# Patient Record
Sex: Female | Born: 1965 | Race: White | Hispanic: No | Smoking: Never smoker
Health system: Southern US, Community
[De-identification: ages and names within clinical notes are randomized; demographics above are authoritative.]

## PROBLEM LIST (undated history)

## (undated) DIAGNOSIS — R519 Headache, unspecified: Secondary | ICD-10-CM

## (undated) DIAGNOSIS — F329 Major depressive disorder, single episode, unspecified: Secondary | ICD-10-CM

## (undated) DIAGNOSIS — R51 Headache: Secondary | ICD-10-CM

## (undated) DIAGNOSIS — F32A Depression, unspecified: Secondary | ICD-10-CM

## (undated) DIAGNOSIS — I10 Essential (primary) hypertension: Secondary | ICD-10-CM

## (undated) HISTORY — DX: Depression, unspecified: F32.A

## (undated) HISTORY — DX: Major depressive disorder, single episode, unspecified: F32.9

## (undated) HISTORY — DX: Essential (primary) hypertension: I10

## (undated) HISTORY — DX: Headache, unspecified: R51.9

## (undated) HISTORY — DX: Headache: R51

## (undated) HISTORY — PX: TUBAL LIGATION: SHX77

---

## 2005-06-22 HISTORY — PX: ABDOMINAL HYSTERECTOMY: SHX81

## 2012-06-07 ENCOUNTER — Ambulatory Visit (INDEPENDENT_AMBULATORY_CARE_PROVIDER_SITE_OTHER): Payer: PRIVATE HEALTH INSURANCE | Admitting: Licensed Clinical Social Worker

## 2012-06-07 DIAGNOSIS — IMO0002 Reserved for concepts with insufficient information to code with codable children: Secondary | ICD-10-CM

## 2012-07-13 ENCOUNTER — Ambulatory Visit (INDEPENDENT_AMBULATORY_CARE_PROVIDER_SITE_OTHER): Payer: PRIVATE HEALTH INSURANCE | Admitting: Licensed Clinical Social Worker

## 2012-07-13 DIAGNOSIS — IMO0002 Reserved for concepts with insufficient information to code with codable children: Secondary | ICD-10-CM

## 2012-08-09 ENCOUNTER — Ambulatory Visit: Payer: PRIVATE HEALTH INSURANCE | Admitting: Licensed Clinical Social Worker

## 2012-08-18 ENCOUNTER — Ambulatory Visit (INDEPENDENT_AMBULATORY_CARE_PROVIDER_SITE_OTHER): Payer: PRIVATE HEALTH INSURANCE | Admitting: Licensed Clinical Social Worker

## 2012-09-06 ENCOUNTER — Ambulatory Visit (INDEPENDENT_AMBULATORY_CARE_PROVIDER_SITE_OTHER): Payer: PRIVATE HEALTH INSURANCE | Admitting: Licensed Clinical Social Worker

## 2012-09-06 DIAGNOSIS — IMO0002 Reserved for concepts with insufficient information to code with codable children: Secondary | ICD-10-CM

## 2012-10-11 ENCOUNTER — Ambulatory Visit (INDEPENDENT_AMBULATORY_CARE_PROVIDER_SITE_OTHER): Payer: PRIVATE HEALTH INSURANCE | Admitting: Licensed Clinical Social Worker

## 2012-10-11 DIAGNOSIS — IMO0002 Reserved for concepts with insufficient information to code with codable children: Secondary | ICD-10-CM

## 2012-11-08 ENCOUNTER — Ambulatory Visit (INDEPENDENT_AMBULATORY_CARE_PROVIDER_SITE_OTHER): Payer: PRIVATE HEALTH INSURANCE | Admitting: Licensed Clinical Social Worker

## 2012-11-08 DIAGNOSIS — IMO0002 Reserved for concepts with insufficient information to code with codable children: Secondary | ICD-10-CM

## 2012-12-13 ENCOUNTER — Ambulatory Visit (INDEPENDENT_AMBULATORY_CARE_PROVIDER_SITE_OTHER): Payer: PRIVATE HEALTH INSURANCE | Admitting: Licensed Clinical Social Worker

## 2012-12-13 DIAGNOSIS — IMO0002 Reserved for concepts with insufficient information to code with codable children: Secondary | ICD-10-CM

## 2013-01-24 ENCOUNTER — Ambulatory Visit (INDEPENDENT_AMBULATORY_CARE_PROVIDER_SITE_OTHER): Payer: PRIVATE HEALTH INSURANCE | Admitting: Licensed Clinical Social Worker

## 2013-01-24 DIAGNOSIS — IMO0002 Reserved for concepts with insufficient information to code with codable children: Secondary | ICD-10-CM

## 2013-02-21 ENCOUNTER — Ambulatory Visit: Payer: PRIVATE HEALTH INSURANCE | Admitting: Licensed Clinical Social Worker

## 2013-03-14 ENCOUNTER — Ambulatory Visit (INDEPENDENT_AMBULATORY_CARE_PROVIDER_SITE_OTHER): Payer: PRIVATE HEALTH INSURANCE | Admitting: Family Medicine

## 2013-03-14 ENCOUNTER — Encounter: Payer: Self-pay | Admitting: Family Medicine

## 2013-03-14 ENCOUNTER — Ambulatory Visit (INDEPENDENT_AMBULATORY_CARE_PROVIDER_SITE_OTHER): Payer: PRIVATE HEALTH INSURANCE | Admitting: Licensed Clinical Social Worker

## 2013-03-14 VITALS — BP 116/76 | HR 70 | Temp 98.6°F | Ht 64.0 in | Wt 188.0 lb

## 2013-03-14 DIAGNOSIS — Z23 Encounter for immunization: Secondary | ICD-10-CM

## 2013-03-14 DIAGNOSIS — F418 Other specified anxiety disorders: Secondary | ICD-10-CM | POA: Insufficient documentation

## 2013-03-14 DIAGNOSIS — G4700 Insomnia, unspecified: Secondary | ICD-10-CM

## 2013-03-14 DIAGNOSIS — IMO0002 Reserved for concepts with insufficient information to code with codable children: Secondary | ICD-10-CM

## 2013-03-14 DIAGNOSIS — E669 Obesity, unspecified: Secondary | ICD-10-CM | POA: Insufficient documentation

## 2013-03-14 DIAGNOSIS — I10 Essential (primary) hypertension: Secondary | ICD-10-CM

## 2013-03-14 DIAGNOSIS — R0609 Other forms of dyspnea: Secondary | ICD-10-CM

## 2013-03-14 DIAGNOSIS — F341 Dysthymic disorder: Secondary | ICD-10-CM

## 2013-03-14 DIAGNOSIS — R0683 Snoring: Secondary | ICD-10-CM

## 2013-03-14 MED ORDER — CHLORTHALIDONE 50 MG PO TABS: 50.0000 mg | ORAL_TABLET | Freq: Every day | ORAL | Status: DC

## 2013-03-14 MED ORDER — CLONAZEPAM 1 MG PO TABS: 1.0000 mg | ORAL_TABLET | Freq: Every day | ORAL | Status: DC

## 2013-03-14 MED ORDER — ESCITALOPRAM OXALATE 20 MG PO TABS: 20.0000 mg | ORAL_TABLET | Freq: Every day | ORAL | Status: DC

## 2013-03-14 NOTE — Assessment & Plan Note (Signed)
con't meds stable 

## 2013-03-14 NOTE — Patient Instructions (Signed)

## 2013-03-14 NOTE — Assessment & Plan Note (Signed)
Cont counseling and meds

## 2013-03-14 NOTE — Progress Notes (Signed)
  Subjective:    Patient here for follow-up of elevated blood pressure.  She is not exercising and is adherent to a low-salt diet.  Blood pressure is well controlled at home. Cardiac symptoms: none. Patient denies: chest pain, chest pressure/discomfort, claudication, dyspnea, exertional chest pressure/discomfort, fatigue, irregular heart beat, lower extremity edema, near-syncope, orthopnea, palpitations, paroxysmal nocturnal dyspnea, syncope and tachypnea. Cardiovascular risk factors: hypertension, obesity (BMI >= 30 kg/m2) and sedentary lifestyle. Use of agents associated with hypertension: none. History of target organ damage: none.  The following portions of the patient's history were reviewed and updated as appropriate: allergies, current medications, past family history, past medical history, past social history, past surgical history and problem list.  Review of Systems Pertinent items are noted in HPI.     Objective:    BP 116/76  Pulse 70  Temp(Src) 98.6 F (37 C) (Oral)  Ht 5\' 4"  (1.626 m)  Wt 188 lb (85.276 kg)  BMI 32.25 kg/m2  SpO2 98% General appearance: alert, cooperative, appears stated age and no distress Neck: no adenopathy, no carotid bruit, no JVD, supple, symmetrical, trachea midline and thyroid not enlarged, symmetric, no tenderness/mass/nodules Lungs: clear to auscultation bilaterally Heart: S1, S2 normal Extremities: extremities normal, atraumatic, no cyanosis or edema    Assessment:    Hypertension, normal blood pressure . Evidence of target organ damage: none.    Plan:    Medication: no change. Dietary sodium restriction. Regular aerobic exercise. Follow up: 6 months and as needed.

## 2013-04-04 ENCOUNTER — Institutional Professional Consult (permissible substitution): Payer: 59 | Admitting: Pulmonary Disease

## 2013-04-11 ENCOUNTER — Ambulatory Visit: Payer: PRIVATE HEALTH INSURANCE | Admitting: Licensed Clinical Social Worker

## 2013-05-10 ENCOUNTER — Other Ambulatory Visit: Payer: Self-pay | Admitting: Family Medicine

## 2013-05-11 NOTE — Telephone Encounter (Signed)
Patient is requesting refill for Klonopin.  Last seen on 03/14/2013, no UDS or contract on file. Please advise. SW

## 2013-05-12 ENCOUNTER — Encounter: Payer: Self-pay | Admitting: Family Medicine

## 2013-05-12 ENCOUNTER — Ambulatory Visit (INDEPENDENT_AMBULATORY_CARE_PROVIDER_SITE_OTHER): Payer: PRIVATE HEALTH INSURANCE | Admitting: Family Medicine

## 2013-05-12 ENCOUNTER — Other Ambulatory Visit: Payer: Self-pay | Admitting: Family Medicine

## 2013-05-12 VITALS — BP 132/84 | HR 82 | Temp 98.0°F | Resp 16 | Wt 193.4 lb

## 2013-05-12 DIAGNOSIS — J0100 Acute maxillary sinusitis, unspecified: Secondary | ICD-10-CM

## 2013-05-12 MED ORDER — AMOXICILLIN 875 MG PO TABS: 875.0000 mg | ORAL_TABLET | Freq: Two times a day (BID) | ORAL | Status: DC

## 2013-05-12 MED ORDER — PROMETHAZINE-DM 6.25-15 MG/5ML PO SYRP: 5.0000 mL | ORAL_SOLUTION | Freq: Four times a day (QID) | ORAL | Status: DC | PRN

## 2013-05-12 NOTE — Assessment & Plan Note (Signed)
Pt's sxs and PE consistent w/ infxn.  Start abx, cough meds prn.  Reviewed supportive care and red flags that should prompt return.  Pt expressed understanding and is in agreement w/ plan.  

## 2013-05-12 NOTE — Patient Instructions (Signed)
Follow up as needed Start the Amoxicillin- twice daily, take w/ food Drink plenty of fluids Use the cough syrup on nights and weekends (will cause drowsiness) REST! Mucinex DM for daytime cough Call with any questions or concerns Hang in there! Happy Holidays!

## 2013-05-12 NOTE — Progress Notes (Signed)
  Subjective:    Patient ID: Kaylee Gray, female    DOB: [birth year], 47 y.o.   MRN: [id number]  HPI Pre visit review using our clinic review tool, if applicable. No additional management support is needed unless otherwise documented below in the visit note.  URI- sxs started 5 days ago w/ a sore throat.  + nasal congestion, HA, sinus pain.  Cough is intermittently productive, chest is now tight.  No fevers.  No known sick contacts.  R ear pain.  + tooth pain.  No N/V/D.   Review of Systems For ROS see HPI     Objective:   Physical Exam  Vitals reviewed. Constitutional: She appears well-developed and well-nourished. No distress.  HENT:  Head: Normocephalic and atraumatic.  Right Ear: Tympanic membrane normal.  Left Ear: Tympanic membrane normal.  Nose: Mucosal edema and rhinorrhea present. Right sinus exhibits maxillary sinus tenderness. Right sinus exhibits no frontal sinus tenderness. Left sinus exhibits maxillary sinus tenderness. Left sinus exhibits no frontal sinus tenderness.  Mouth/Throat: Uvula is midline and mucous membranes are normal. Posterior oropharyngeal erythema present. No oropharyngeal exudate.  Eyes: Conjunctivae and EOM are normal. Pupils are equal, round, and reactive to light.  Neck: Normal range of motion. Neck supple.  Cardiovascular: Normal rate, regular rhythm and normal heart sounds.   Pulmonary/Chest: Effort normal and breath sounds normal. No respiratory distress. She has no wheezes.  Lymphadenopathy:    She has no cervical adenopathy.          Assessment & Plan:

## 2013-05-29 ENCOUNTER — Encounter: Payer: Self-pay | Admitting: Family Medicine

## 2013-05-29 ENCOUNTER — Ambulatory Visit (INDEPENDENT_AMBULATORY_CARE_PROVIDER_SITE_OTHER): Payer: PRIVATE HEALTH INSURANCE | Admitting: Family Medicine

## 2013-05-29 VITALS — BP 112/76 | HR 73 | Temp 98.7°F | Wt 191.0 lb

## 2013-05-29 DIAGNOSIS — J209 Acute bronchitis, unspecified: Secondary | ICD-10-CM

## 2013-05-29 DIAGNOSIS — R0789 Other chest pain: Secondary | ICD-10-CM

## 2013-05-29 MED ORDER — HYDROCODONE-HOMATROPINE 5-1.5 MG/5ML PO SYRP: 5.0000 mL | ORAL_SOLUTION | Freq: Four times a day (QID) | ORAL | Status: DC | PRN

## 2013-05-29 MED ORDER — AZITHROMYCIN 250 MG PO TABS: ORAL_TABLET | ORAL | Status: DC

## 2013-05-29 MED ORDER — ALBUTEROL SULFATE (2.5 MG/3ML) 0.083% IN NEBU
2.5000 mg | INHALATION_SOLUTION | Freq: Once | RESPIRATORY_TRACT | Status: AC
  Administered 2013-05-29: 2.5 mg via RESPIRATORY_TRACT

## 2013-05-29 MED ORDER — PREDNISONE 10 MG PO TABS: ORAL_TABLET | ORAL | Status: DC

## 2013-05-29 NOTE — Patient Instructions (Addendum)
rto 2 weeks for f/u-- you can cancel the appointment if symptoms completely resolved.    Bronchitis Bronchitis is the body's way of reacting to injury and/or infection (inflammation) of the bronchi. Bronchi are the air tubes that extend from the windpipe into the lungs. If the inflammation becomes severe, it may cause shortness of breath. CAUSES  Inflammation may be caused by:  A virus.  Germs (bacteria).  Dust.  Allergens.  Pollutants and many other irritants. The cells lining the bronchial tree are covered with tiny hairs (cilia). These constantly beat upward, away from the lungs, toward the mouth. This keeps the lungs free of pollutants. When these cells become too irritated and are unable to do their job, mucus begins to develop. This causes the characteristic cough of bronchitis. The cough clears the lungs when the cilia are unable to do their job. Without either of these protective mechanisms, the mucus would settle in the lungs. Then you would develop pneumonia. Smoking is a common cause of bronchitis and can contribute to pneumonia. Stopping this habit is the single most important thing you can do to help yourself. TREATMENT   Your caregiver may prescribe an antibiotic if the cough is caused by bacteria. Also, medicines that open up your airways make it easier to breathe. Your caregiver may also recommend or prescribe an expectorant. It will loosen the mucus to be coughed up. Only take over-the-counter or prescription medicines for pain, discomfort, or fever as directed by your caregiver.  Removing whatever causes the problem (smoking, for example) is critical to preventing the problem from getting worse.  Cough suppressants may be prescribed for relief of cough symptoms.  Inhaled medicines may be prescribed to help with symptoms now and to help prevent problems from returning.  For those with recurrent (chronic) bronchitis, there may be a need for steroid medicines. SEEK  IMMEDIATE MEDICAL CARE IF:   During treatment, you develop more pus-like mucus (purulent sputum).  You have a fever.  You become progressively more ill.  You have increased difficulty breathing, wheezing, or shortness of breath. It is necessary to seek immediate medical care if you are elderly or sick from any other disease. MAKE SURE YOU:   Understand these instructions.  Will watch your condition.  Will get help right away if you are not doing well or get worse. Document Released: 06/08/2005 Document Revised: 02/08/2013 Document Reviewed: 01/31/2013 [facility] [place] Patient Information 2014 [place], [place].

## 2013-05-29 NOTE — Progress Notes (Signed)
Pre visit review using our clinic review tool, if applicable. No additional management support is needed unless otherwise documented below in the visit note. 

## 2013-05-29 NOTE — Progress Notes (Signed)
  Subjective:     Kaylee Gray is a 47 y.o. female here for evaluation of a cough. Onset of symptoms was several weeks ago. Symptoms have been gradually worsening since that time. The cough is barky, harsh and productive and is aggravated by infection and reclining position. Associated symptoms include: postnasal drip, shortness of breath, sputum production and wheezing. Patient does not have a history of asthma. Patient does not have a history of environmental allergens. Patient has not traveled recently. Patient does not have a history of smoking. Patient has not had a previous chest x-ray. Patient has not had a PPD done.  The following portions of the patient's history were reviewed and updated as appropriate: allergies, current medications, past family history, past medical history, past social history, past surgical history and problem list.  Review of Systems Pertinent items are noted in HPI.    Objective:    Oxygen saturation 97% on room air BP 112/76  Pulse 73  Temp(Src) 98.7 F (37.1 C) (Oral)  Wt 191 lb (86.637 kg)  SpO2 97% General appearance: alert, cooperative, appears stated age and no distress Ears: normal TM's and external ear canals both ears Nose: Nares normal. Septum midline. Mucosa normal. No drainage or sinus tenderness. Throat: abnormal findings: moderate oropharyngeal erythema and pnd Neck: no adenopathy, supple, symmetrical, trachea midline and thyroid not enlarged, symmetric, no tenderness/mass/nodules Lungs: diminished breath sounds bilaterally and wheezes bilaterally Heart: S1, S2 normal    Assessment:    Acute Bronchitis    Plan:    Antibiotics per medication orders. Antitussives per medication orders. Avoid exposure to tobacco smoke and fumes. Call if shortness of breath worsens, blood in sputum, change in character of cough, development of fever or chills, inability to maintain nutrition and hydration. Avoid exposure to tobacco smoke and  fumes. steroid taper

## 2013-05-31 ENCOUNTER — Telehealth: Payer: Self-pay | Admitting: *Deleted

## 2013-05-31 NOTE — Telephone Encounter (Signed)
Patient called and left message on triage phone stating that she is not feeling any better and would like for someone to return her call. Called patient and left message for her return call to office.

## 2013-05-31 NOTE — Telephone Encounter (Signed)
Patient called and stated that she has not been able to urinate since starting on the medications that was prescribed to her on 05/29/2013. Patient states that she read that oliguria is a side effect for one of the medication but not sure which on. Patients states that her bladder hurts because she is unable to urinate. Would like to know what can be done about this situation. Please advise. SW

## 2013-05-31 NOTE — Telephone Encounter (Signed)
Patient was made aware to stop taking the cough medicine and she states that she is not taking any medications over the counter. Patient will call the office if symptoms doesn't improve. SW

## 2013-05-31 NOTE — Telephone Encounter (Signed)
Stop the cough syrup and see if it improves, if she is taking otc antihistamine , that can cause it as well.

## 2013-06-08 ENCOUNTER — Telehealth: Payer: Self-pay | Admitting: Family Medicine

## 2013-06-08 MED ORDER — NYSTATIN 100000 UNIT/ML MT SUSP: 5.0000 mL | Freq: Four times a day (QID) | OROMUCOSAL | Status: DC

## 2013-06-08 NOTE — Telephone Encounter (Signed)
Med filled and patient notified.

## 2013-06-08 NOTE — Telephone Encounter (Signed)
Dr Lowne pt 

## 2013-06-08 NOTE — Telephone Encounter (Signed)
Patient states that the antibiotic is causing her to have thrush. She is unable to keep her appointment she had scheduled for Monday and states that she cannot come in tomorrow. Wants to know if she can be worked in this afternoon or if something can be called in to her pharmacy.

## 2013-06-08 NOTE — Telephone Encounter (Signed)
Nystatin swish and spit 5 ml po qid   150  cc 

## 2013-06-12 ENCOUNTER — Ambulatory Visit: Payer: PRIVATE HEALTH INSURANCE | Admitting: Family Medicine

## 2013-06-23 ENCOUNTER — Telehealth: Payer: Self-pay | Admitting: *Deleted

## 2013-06-23 ENCOUNTER — Other Ambulatory Visit: Payer: Self-pay | Admitting: Family Medicine

## 2013-06-23 NOTE — Telephone Encounter (Signed)
Last seen 05/29/13 and filled 05/10/13 #30. Please advise     KP

## 2013-06-23 NOTE — Telephone Encounter (Signed)
Patient called and stated that she need a refill on her Clonazepam. After reviewing patient chart medication has already been filled and sent to pharmacy

## 2013-07-04 ENCOUNTER — Other Ambulatory Visit: Payer: Self-pay | Admitting: Family Medicine

## 2013-07-20 ENCOUNTER — Other Ambulatory Visit: Payer: Self-pay | Admitting: Family Medicine

## 2013-07-21 NOTE — Telephone Encounter (Signed)
Last seen 05/29/13 and filled 06/23/13 #30. Please advise KP No contract or UDS on file.    Please advise      KP

## 2013-08-22 ENCOUNTER — Other Ambulatory Visit: Payer: Self-pay | Admitting: Family Medicine

## 2013-08-23 ENCOUNTER — Other Ambulatory Visit: Payer: Self-pay | Admitting: Family Medicine

## 2013-08-23 DIAGNOSIS — G4700 Insomnia, unspecified: Secondary | ICD-10-CM

## 2013-08-23 NOTE — Telephone Encounter (Signed)
Patient request a refill on Klonopin 1 mg and would like an additional refill.  Last refill 07/20/2013 Acute visit 05/29/2013 No UDS or contract on file.

## 2013-08-23 NOTE — Telephone Encounter (Signed)
Rx for klonopin faxed to [facility], pt made aware

## 2013-08-24 ENCOUNTER — Telehealth: Payer: Self-pay | Admitting: *Deleted

## 2013-08-24 NOTE — Telephone Encounter (Signed)
Rx fro klonopin faxed to [facility], pt made aware.

## 2013-09-17 ENCOUNTER — Other Ambulatory Visit: Payer: Self-pay | Admitting: Family Medicine

## 2013-09-21 ENCOUNTER — Other Ambulatory Visit: Payer: Self-pay | Admitting: Family Medicine

## 2013-09-25 NOTE — Telephone Encounter (Signed)
Last seen 05/29/13 and filled 08/23/13 #30 No UDS or contract on file.   Please advise      KP

## 2013-09-25 NOTE — Telephone Encounter (Addendum)
Patient called and stated that her pharmacy still does not have a refill for her clonazePAM (KLONOPIN) 1 MG tablet. Please advise

## 2013-10-05 ENCOUNTER — Ambulatory Visit (INDEPENDENT_AMBULATORY_CARE_PROVIDER_SITE_OTHER): Payer: PRIVATE HEALTH INSURANCE | Admitting: Licensed Clinical Social Worker

## 2013-10-05 DIAGNOSIS — F331 Major depressive disorder, recurrent, moderate: Secondary | ICD-10-CM

## 2013-10-17 ENCOUNTER — Ambulatory Visit (INDEPENDENT_AMBULATORY_CARE_PROVIDER_SITE_OTHER): Payer: PRIVATE HEALTH INSURANCE | Admitting: Licensed Clinical Social Worker

## 2013-10-17 DIAGNOSIS — IMO0002 Reserved for concepts with insufficient information to code with codable children: Secondary | ICD-10-CM

## 2013-10-24 ENCOUNTER — Ambulatory Visit (INDEPENDENT_AMBULATORY_CARE_PROVIDER_SITE_OTHER): Payer: PRIVATE HEALTH INSURANCE | Admitting: Licensed Clinical Social Worker

## 2013-10-24 DIAGNOSIS — IMO0002 Reserved for concepts with insufficient information to code with codable children: Secondary | ICD-10-CM

## 2013-10-26 ENCOUNTER — Telehealth: Payer: Self-pay | Admitting: Family Medicine

## 2013-10-26 MED ORDER — CLONAZEPAM 1 MG PO TABS: ORAL_TABLET | ORAL | Status: DC

## 2013-10-26 NOTE — Telephone Encounter (Signed)
Rx sent to [facility] on [address]     Elaina PatteeKP

## 2013-10-26 NOTE — Telephone Encounter (Signed)
Caller name:Kaylee Bascom LevelsFrazier  Relation to GN:FAOZHYQpt:patient Call back number:[phone] Pharmacy:  Reason for call: to request a refill for clonazePAM (KLONOPIN) 1 MG tablet

## 2013-10-26 NOTE — Telephone Encounter (Signed)
Ok to refill x1  2 refills 

## 2013-10-26 NOTE — Telephone Encounter (Signed)
Last seen 05/29/13 and filled 09/25/13 #30 Contract signed 09/28/13   Please advise     KP

## 2013-10-31 ENCOUNTER — Encounter: Payer: Self-pay | Admitting: Family Medicine

## 2013-11-16 ENCOUNTER — Ambulatory Visit (INDEPENDENT_AMBULATORY_CARE_PROVIDER_SITE_OTHER): Payer: PRIVATE HEALTH INSURANCE | Admitting: Licensed Clinical Social Worker

## 2013-11-16 DIAGNOSIS — IMO0002 Reserved for concepts with insufficient information to code with codable children: Secondary | ICD-10-CM

## 2013-11-23 ENCOUNTER — Encounter: Payer: Self-pay | Admitting: Family Medicine

## 2013-11-23 ENCOUNTER — Ambulatory Visit (INDEPENDENT_AMBULATORY_CARE_PROVIDER_SITE_OTHER): Payer: PRIVATE HEALTH INSURANCE | Admitting: Family Medicine

## 2013-11-23 VITALS — BP 110/72 | HR 57 | Temp 98.6°F | Ht 64.0 in | Wt 188.0 lb

## 2013-11-23 DIAGNOSIS — I10 Essential (primary) hypertension: Secondary | ICD-10-CM

## 2013-11-23 DIAGNOSIS — J329 Chronic sinusitis, unspecified: Secondary | ICD-10-CM

## 2013-11-23 MED ORDER — CEFDINIR 300 MG PO CAPS: 300.0000 mg | ORAL_CAPSULE | Freq: Two times a day (BID) | ORAL | Status: DC

## 2013-11-23 NOTE — Progress Notes (Signed)
Pre visit review using our clinic review tool, if applicable. No additional management support is needed unless otherwise documented below in the visit note. 

## 2013-11-23 NOTE — Patient Instructions (Signed)
Mucinex 600 mg twice a day x 14 days Probiotic daily such as [employer]  Sinusitis Sinusitis is redness, soreness, and swelling (inflammation) of the paranasal sinuses. Paranasal sinuses are air pockets within the bones of your face (beneath the eyes, the middle of the forehead, or above the eyes). In healthy paranasal sinuses, mucus is able to drain out, and air is able to circulate through them by way of your nose. However, when your paranasal sinuses are inflamed, mucus and air can become trapped. This can allow bacteria and other germs to grow and cause infection. Sinusitis can develop quickly and last only a short time (acute) or continue over a long period (chronic). Sinusitis that lasts for more than 12 weeks is considered chronic.  CAUSES  Causes of sinusitis include:  Allergies.  Structural abnormalities, such as displacement of the cartilage that separates your nostrils (deviated septum), which can decrease the air flow through your nose and sinuses and affect sinus drainage.  Functional abnormalities, such as when the small hairs (cilia) that line your sinuses and help remove mucus do not work properly or are not present. SYMPTOMS  Symptoms of acute and chronic sinusitis are the same. The primary symptoms are pain and pressure around the affected sinuses. Other symptoms include:  Upper toothache.  Earache.  Headache.  Bad breath.  Decreased sense of smell and taste.  A cough, which worsens when you are lying flat.  Fatigue.  Fever.  Thick drainage from your nose, which often is green and may contain pus (purulent).  Swelling and warmth over the affected sinuses. DIAGNOSIS  Your caregiver will perform a physical exam. During the exam, your caregiver may:  Look in your nose for signs of abnormal growths in your nostrils (nasal polyps).  Tap over the affected sinus to check for signs of infection.  View the inside of your sinuses (endoscopy) with a  special imaging device with a light attached (endoscope), which is inserted into your sinuses. If your caregiver suspects that you have chronic sinusitis, one or more of the following tests may be recommended:  Allergy tests.  Nasal culture A sample of mucus is taken from your nose and sent to a lab and screened for bacteria.  Nasal cytology A sample of mucus is taken from your nose and examined by your caregiver to determine if your sinusitis is related to an allergy. TREATMENT  Most cases of acute sinusitis are related to a viral infection and will resolve on their own within 10 days. Sometimes medicines are prescribed to help relieve symptoms (pain medicine, decongestants, nasal steroid sprays, or saline sprays).  However, for sinusitis related to a bacterial infection, your caregiver will prescribe antibiotic medicines. These are medicines that will help kill the bacteria causing the infection.  Rarely, sinusitis is caused by a fungal infection. In theses cases, your caregiver will prescribe antifungal medicine. For some cases of chronic sinusitis, surgery is needed. Generally, these are cases in which sinusitis recurs more than 3 times per year, despite other treatments. HOME CARE INSTRUCTIONS   Drink plenty of water. Water helps thin the mucus so your sinuses can drain more easily.  Use a humidifier.  Inhale steam 3 to 4 times a day (for example, sit in the bathroom with the shower running).  Apply a warm, moist washcloth to your face 3 to 4 times a day, or as directed by your caregiver.  Use saline nasal sprays to help moisten and clean your sinuses.  Take over-the-counter  or prescription medicines for pain, discomfort, or fever only as directed by your caregiver. SEEK IMMEDIATE MEDICAL CARE IF:  You have increasing pain or severe headaches.  You have nausea, vomiting, or drowsiness.  You have swelling around your face.  You have vision problems.  You have a stiff  neck.  You have difficulty breathing. MAKE SURE YOU:   Understand these instructions.  Will watch your condition.  Will get help right away if you are not doing well or get worse. Document Released: 06/08/2005 Document Revised: 08/31/2011 Document Reviewed: 06/23/2011 [facility] Patient Information 2014 [place], [place].

## 2013-11-27 ENCOUNTER — Encounter: Payer: Self-pay | Admitting: Family Medicine

## 2013-11-27 DIAGNOSIS — J329 Chronic sinusitis, unspecified: Secondary | ICD-10-CM | POA: Insufficient documentation

## 2013-11-27 NOTE — Progress Notes (Signed)
Patient ID: Kaylee Gray, female   DOB: 04-06-1966, 48 y.o.   MRN: [id number] Kaylee Gray [id number] Jan 22, 1966 11/27/2013      Progress Note-Follow Up  Subjective  Chief Complaint  Chief Complaint  Patient presents with  . Headache    X 3 days  . Sinusitis    X 3 days  . Otalgia    X 3 days- left  . Cough    X 3 days- no production    HPI  Patient is a 48 year old female in today for routine medical care. She is complaining of several days worth of head congestion and headache. She has frontal and maxillary pressure as well as nasal congestion. She has some intermittent left ear pain and cough. She complains of sore throat, malaise and myalgias. Denies fevers and chills. Denies CP/palp/SOBfevers/GI or GU c/o. Taking meds as prescribed  Past Medical History  Diagnosis Date  . Depression   . Hypertension   . Frequent headaches     Past Surgical History  Procedure Laterality Date  . Tubal ligation    . Cesarean section    . Abdominal hysterectomy  2007    endometriosis,  TAH BSO    Family History  Problem Relation Age of Onset  . COPD Mother   . COPD Father   . Emphysema Mother     Smoker  . Emphysema Father     Smoker    History   Social History  . Marital Status: Divorced    Spouse Name: N/A    Number of Children: N/A  . Years of Education: N/A   Occupational History  . Not on file.   Social History Main Topics  . Smoking status: Never Smoker   . Smokeless tobacco: Never Used  . Alcohol Use: No  . Drug Use: No  . Sexual Activity: Not on file   Other Topics Concern  . Not on file   Social History Narrative  . No narrative on file    Current Outpatient Prescriptions on File Prior to Visit  Medication Sig Dispense Refill  . chlorthalidone (HYGROTON) 50 MG tablet TAKE 1 TABLET DAILY.  90 tablet  1  . clonazePAM (KLONOPIN) 1 MG tablet TAKE 1 TABLET BY MOUTH EVERY DAY  30 tablet  2  . escitalopram (LEXAPRO) 20 MG tablet TAKE 1 TABLET (20 MG  TOTAL) BY MOUTH DAILY.  90 tablet  1   No current facility-administered medications on file prior to visit.    No Known Allergies  Review of Systems  Review of Systems  Constitutional: Positive for malaise/fatigue. Negative for fever.  HENT: Positive for congestion, ear pain and sore throat.   Eyes: Negative for discharge.  Respiratory: Positive for cough. Negative for shortness of breath.   Cardiovascular: Negative for chest pain, palpitations and leg swelling.  Gastrointestinal: Negative for nausea, abdominal pain and diarrhea.  Genitourinary: Negative for dysuria.  Musculoskeletal: Positive for myalgias. Negative for falls.  Skin: Negative for rash.  Neurological: Positive for headaches. Negative for loss of consciousness.  Endo/Heme/Allergies: Negative for polydipsia.  Psychiatric/Behavioral: Negative for depression and suicidal ideas. The patient is not nervous/anxious and does not have insomnia.     Objective  BP 110/72  Pulse 57  Temp(Src) 98.6 F (37 C) (Oral)  Ht 5\' 4"  (1.626 m)  Wt 188 lb 0.6 oz (85.294 kg)  BMI 32.26 kg/m2  SpO2 97%  Physical Exam  Physical Exam  Constitutional: She is oriented to person, place, and time  and well-developed, well-nourished, and in no distress. No distress.  HENT:  Head: Normocephalic and atraumatic.  Eyes: Conjunctivae are normal.  Neck: Neck supple. No thyromegaly present.  Cardiovascular: Normal rate, regular rhythm and normal heart sounds.   No murmur heard. Pulmonary/Chest: Effort normal and breath sounds normal. She has no wheezes.  Abdominal: She exhibits no distension and no mass.  Musculoskeletal: She exhibits no edema.  Lymphadenopathy:    She has no cervical adenopathy.  Neurological: She is alert and oriented to person, place, and time.  Skin: Skin is warm and dry. No rash noted. She is not diaphoretic.  Psychiatric: Memory, affect and judgment normal.      Assessment & Plan  HTN (hypertension) Well  controlled, no changes to meds. Encouraged heart healthy diet such as the DASH diet and exercise as tolerated.   Sinusitis Started on antibiotics and probiotics, use Mucinex bid and increase rest and fluids

## 2013-11-27 NOTE — Assessment & Plan Note (Signed)
Started on antibiotics and probiotics, use Mucinex bid and increase rest and fluids

## 2013-11-27 NOTE — Assessment & Plan Note (Signed)
Well controlled, no changes to meds. Encouraged heart healthy diet such as the DASH diet and exercise as tolerated.  °

## 2013-12-14 ENCOUNTER — Ambulatory Visit: Payer: PRIVATE HEALTH INSURANCE | Admitting: Licensed Clinical Social Worker

## 2013-12-19 ENCOUNTER — Ambulatory Visit (INDEPENDENT_AMBULATORY_CARE_PROVIDER_SITE_OTHER): Payer: PRIVATE HEALTH INSURANCE | Admitting: Licensed Clinical Social Worker

## 2013-12-19 DIAGNOSIS — IMO0002 Reserved for concepts with insufficient information to code with codable children: Secondary | ICD-10-CM

## 2013-12-29 ENCOUNTER — Other Ambulatory Visit: Payer: Self-pay | Admitting: Family Medicine

## 2013-12-29 NOTE — Telephone Encounter (Signed)
Med filled.  

## 2014-01-04 ENCOUNTER — Ambulatory Visit (HOSPITAL_BASED_OUTPATIENT_CLINIC_OR_DEPARTMENT_OTHER)
Admission: RE | Admit: 2014-01-04 | Discharge: 2014-01-04 | Disposition: A | Discharge status: Home / Self Care | Payer: 59 | Source: Ambulatory Visit | Attending: Medical | Admitting: Medical

## 2014-01-04 ENCOUNTER — Ambulatory Visit (INDEPENDENT_AMBULATORY_CARE_PROVIDER_SITE_OTHER): Payer: PRIVATE HEALTH INSURANCE | Admitting: Physician Assistant

## 2014-01-04 ENCOUNTER — Encounter: Payer: Self-pay | Admitting: Physician Assistant

## 2014-01-04 VITALS — BP 94/70 | HR 85 | Temp 98.1°F | Ht 64.0 in | Wt 182.0 lb

## 2014-01-04 DIAGNOSIS — K5900 Constipation, unspecified: Secondary | ICD-10-CM | POA: Insufficient documentation

## 2014-01-04 DIAGNOSIS — R141 Gas pain: Secondary | ICD-10-CM | POA: Insufficient documentation

## 2014-01-04 DIAGNOSIS — R109 Unspecified abdominal pain: Secondary | ICD-10-CM | POA: Insufficient documentation

## 2014-01-04 DIAGNOSIS — R142 Eructation: Secondary | ICD-10-CM

## 2014-01-04 DIAGNOSIS — R143 Flatulence: Secondary | ICD-10-CM

## 2014-01-04 LAB — CBC
HCT: 38.1 % (ref 36.0–46.0)
Hemoglobin: 13.4 g/dL (ref 12.0–15.0)
MCH: 27.5 pg (ref 26.0–34.0)
MCHC: 35.2 g/dL (ref 30.0–36.0)
MCV: 78.1 fL (ref 78.0–100.0)
PLATELETS: 382 10*3/uL (ref 150–400)
RBC: 4.88 MIL/uL (ref 3.87–5.11)
RDW: 14.7 % (ref 11.5–15.5)
WBC: 7.8 10*3/uL (ref 4.0–10.5)

## 2014-01-04 LAB — DIFFERENTIAL
BASOS ABS: 0.1 10*3/uL (ref 0.0–0.1)
Basophils Relative: 1 % (ref 0–1)
Eosinophils Absolute: 0.2 10*3/uL (ref 0.0–0.7)
Eosinophils Relative: 2 % (ref 0–5)
LYMPHS PCT: 32 % (ref 12–46)
Lymphs Abs: 2.5 10*3/uL (ref 0.7–4.0)
Monocytes Absolute: 0.5 10*3/uL (ref 0.1–1.0)
Monocytes Relative: 7 % (ref 3–12)
NEUTROS PCT: 58 % (ref 43–77)
Neutro Abs: 4.5 10*3/uL (ref 1.7–7.7)

## 2014-01-04 LAB — BASIC METABOLIC PANEL
BUN: 13 mg/dL (ref 6–23)
CHLORIDE: 97 meq/L (ref 96–112)
CO2: 31 mEq/L (ref 19–32)
Calcium: 9.1 mg/dL (ref 8.4–10.5)
Creat: 1.13 mg/dL — ABNORMAL HIGH (ref 0.50–1.10)
Glucose, Bld: 88 mg/dL (ref 70–99)
Potassium: 2.7 mEq/L — CL (ref 3.5–5.3)
Sodium: 139 mEq/L (ref 135–145)

## 2014-01-04 NOTE — Progress Notes (Signed)
   Subjective:    Patient ID: Kaylee Gray, female    DOB: 11-21-1965, 48 y.o.   MRN: [id number]  HPI  Pt in with constipation. Her baseline bm is about every other day. In June she had 10 days of constipation. She did finally had bm and they normalized. But then last 5 days she had no bm. Monday she tried otc med. It did not help much. No signficant bm. Pt had hystercomy. 1996 dermoid cyst. 1996 tubes ligation. 2007 had hysteretcomy. Pt feels bloated. She is passing scant gas. No fever.   Review of Systems  Constitutional: Positive for fatigue. Negative for fever, chills and diaphoresis.       Mild fatigue.  Respiratory: Negative for chest tightness, shortness of breath and wheezing.   Cardiovascular: Negative for chest pain and leg swelling.  Gastrointestinal: Positive for abdominal pain. Negative for nausea, vomiting, diarrhea, constipation, abdominal distention and rectal pain.       Describes more bloated sensation. Mild lt lower quadrant faint tenderness on physical exam.  Genitourinary: Negative for dysuria, frequency, hematuria, flank pain, decreased urine volume, difficulty urinating and pelvic pain.  Musculoskeletal:       Mild rt SI tenderness. No cva pain.      Objective:   Physical Exam General Appearance- Not in acute distress.  HEENT Eyes- Scleraeral/Conjuntiva-bilat- Not Yellow. Mouth & Throat- Normal.  Chest and Lung Exam Auscultation: Breath sounds:-Normal. Adventitious sounds:- No Adventitious sounds.  Cardiovascular Auscultation:Rythm - Regular. Heart Sounds -Normal heart sounds.  Abdomen Inspection:-Inspection Normal.  Palpation/Perucssion: Palpation and Percussion of the abdomen reveal- mild/faint llq  Tender, No Rebound tenderness, No rigidity(Guarding) and No Palpable abdominal masses. No heal jar pain. Liver:-Normal.  Spleen:- Normal.   Back No cva tenderness.     Assessment & Plan:  Unspecified constipation Fiber supplement otc, probiotic  and miralax otc. Abdomen 2 view xray and labs. Will follow labs and notify pt. Will alter treatment if indicated by imaging or blood work.  Alarm signs/symptoms discussed with patient.  Patient aware if symptoms were to appear, to proceed to the ER.  Abdominal discomfort Associated with constipation. Will see if clears with measures advised.   The note above belongs to Whole FoodsEdward Kain Milosevic, PA-C who is finishing his on-site EPIC training with me.  I have personally seen patient and am in agreement with the aforementioned plan.    Waldon MerlWilliam C. Martin, PA-C

## 2014-01-04 NOTE — Assessment & Plan Note (Addendum)
Fiber supplement otc, probiotic and miralax otc. Abdomen 2 view xray and labs. Will follow labs and notify pt. Will alter treatment if indicated by imaging or blood work.  Alarm signs/symptoms discussed with patient.  Patient aware if symptoms were to appear, to proceed to the ER.

## 2014-01-04 NOTE — Patient Instructions (Signed)
Fiber supplement, probiotic otc. Miralax otc. Encouraged in future to drink more water. Get labs and xray today. In event of increasingly severe abdominal discomfort then ED evaluation. Please update us by tomorrow afternoon if has had bm.   Constipation Constipation is when a person has fewer than three bowel movements a week, has difficulty having a bowel movement, or has stools that are dry, hard, or larger than normal. As people grow older, constipation is more common. If you try to fix constipation with medicines that make you have a bowel movement (laxatives), the problem may get worse. Long-term laxative use may cause the muscles of the colon to become weak. A low-fiber diet, not taking in enough fluids, and taking certain medicines may make constipation worse.  CAUSES   Certain medicines, such as antidepressants, pain medicine, iron supplements, antacids, and water pills.   Certain diseases, such as diabetes, irritable bowel syndrome (IBS), thyroid disease, or depression.   Not drinking enough water.   Not eating enough fiber-rich foods.   Stress or travel.   Lack of physical activity or exercise.   Ignoring the urge to have a bowel movement.   Using laxatives too much.  SIGNS AND SYMPTOMS   Having fewer than three bowel movements a week.   Straining to have a bowel movement.   Having stools that are hard, dry, or larger than normal.   Feeling full or bloated.   Pain in the lower abdomen.   Not feeling relief after having a bowel movement.  DIAGNOSIS  Your health care provider will take a medical history and perform a physical exam. Further testing may be done for severe constipation. Some tests may include:  A barium enema X-ray to examine your rectum, colon, and, sometimes, your small intestine.   A sigmoidoscopy to examine your lower colon.   A colonoscopy to examine your entire colon. TREATMENT  Treatment will depend on the severity of your  constipation and what is causing it. Some dietary treatments include drinking more fluids and eating more fiber-rich foods. Lifestyle treatments may include regular exercise. If these diet and lifestyle recommendations do not help, your health care provider may recommend taking over-the-counter laxative medicines to help you have bowel movements. Prescription medicines may be prescribed if over-the-counter medicines do not work.  HOME CARE INSTRUCTIONS   Eat foods that have a lot of fiber, such as fruits, vegetables, whole grains, and beans.  Limit foods high in fat and processed sugars, such as french fries, hamburgers, cookies, candies, and soda.   A fiber supplement may be added to your diet if you cannot get enough fiber from foods.   Drink enough fluids to keep your urine clear or pale yellow.   Exercise regularly or as directed by your health care provider.   Go to the restroom when you have the urge to go. Do not hold it.   Only take over-the-counter or prescription medicines as directed by your health care provider. Do not take other medicines for constipation without talking to your health care provider first.  SEEK IMMEDIATE MEDICAL CARE IF:   You have bright red blood in your stool.   Your constipation lasts for more than 4 days or gets worse.   You have abdominal or rectal pain.   You have thin, pencil-like stools.   You have unexplained weight loss. MAKE SURE YOU:   Understand these instructions.  Will watch your condition.  Will get help right away if you are not doing well  or get worse. Document Released: 03/06/2004 Document Revised: 06/13/2013 Document Reviewed: 03/20/2013 [facility] Patient Information 2015 [place], [place]. This information is not intended to replace advice given to you by your health care provider. Make sure you discuss any questions you have with your health care provider.

## 2014-01-04 NOTE — Progress Notes (Signed)
Pre visit review using our clinic review tool, if applicable. No additional management support is needed unless otherwise documented below in the visit note. 

## 2014-01-04 NOTE — Assessment & Plan Note (Signed)
Associated with constipation. Will see if clears with measures advised.

## 2014-01-05 ENCOUNTER — Other Ambulatory Visit: Payer: Self-pay | Admitting: Family Medicine

## 2014-01-05 ENCOUNTER — Telehealth: Payer: Self-pay | Admitting: *Deleted

## 2014-01-05 ENCOUNTER — Telehealth: Payer: Self-pay | Admitting: Physician Assistant

## 2014-01-05 DIAGNOSIS — E876 Hypokalemia: Secondary | ICD-10-CM

## 2014-01-05 MED ORDER — POTASSIUM CHLORIDE CRYS ER 20 MEQ PO TBCR: 20.0000 meq | EXTENDED_RELEASE_TABLET | Freq: Two times a day (BID) | ORAL | Status: DC

## 2014-01-05 NOTE — Telephone Encounter (Signed)
Pt k is low. Will call in 14 days of k-dur 20 meq. Repeat bmp in 2 wks. Please verify with pt what pharmacy.

## 2014-01-05 NOTE — Telephone Encounter (Signed)
Pt has questions about the Rx potassium chloride SA (K-DUR,KLOR-CON) [facility]Q [facility]  Pt would like to speak with Jasmine [place]Sharon or [place].

## 2014-01-05 NOTE — Telephone Encounter (Addendum)
Her medication could possibly be the cause but its likely stemming from the medication + the fact that she has not eaten of drank much fluids.  Her potassium level needs to come up so she needs to take the supplement and increase her food intake.  Cut BP medication in half and take 1/2 tablet over the next week.  Follow-up with Dr. Laury AxonLowne in 1 week.    Notes Recorded by Piedad ClimesWilliam Cody Martin, PA-C on 01/05/2014 at 3:49 PM Not taking the potassium supplement is against medical advice. I highly recommend she take the supplementation over the next two weeks. Notes Recorded by Esperanza RichtersEdward Saguier, PA-C on 01/05/2014 at 3:47 PM Her cbc is normal. So she can be advised when Jasmine DecemberSharon calls pt back. Notes Recorded by Esperanza RichtersEdward Saguier, PA-C on 01/05/2014 at 8:19 AM Also decided to change sig on k-dur to bid  Patrice Paradise[Note Sent In Error To PCP: ReRoute To Kristine GarbeWilliam C Martin]/SLS

## 2014-01-05 NOTE — Telephone Encounter (Signed)
Please Advise

## 2014-01-05 NOTE — Telephone Encounter (Signed)
Patient informed, understood & agreed/SLS  

## 2014-01-05 NOTE — Telephone Encounter (Signed)
LMOM with contact name and number for return call RE: results and further provider instructions/SLS  

## 2014-01-05 NOTE — Telephone Encounter (Signed)
[facility] re: Critical Potassium-- 2.7 from 01/04/14.  Please advise.

## 2014-01-05 NOTE — Telephone Encounter (Signed)
Patient returned phone call. °

## 2014-01-05 NOTE — Telephone Encounter (Signed)
Patient informed, understood & agreed; Rx to pharmacy & new lab order placed/SLS

## 2014-01-05 NOTE — Telephone Encounter (Signed)
Please call patient with Edward's instructions.

## 2014-01-05 NOTE — Telephone Encounter (Signed)
Completed.See 07.17.15 phone note. Refill request needs to be completed via provider at pt's PCP office.

## 2014-01-05 NOTE — Telephone Encounter (Signed)
Patient left message in regards to her potassium levels, patient states she did some research and taking diuretics can cause low potassium, her BP med has a diuretic in it and she wants to know if this could possibly be causing her potassium to be low and if she should continue to take the same medication

## 2014-01-08 NOTE — Telephone Encounter (Signed)
Rx was denied was filled on (12/29/13).//AB/CMA

## 2014-01-15 LAB — BASIC METABOLIC PANEL
BUN: 16 mg/dL (ref 6–23)
CO2: 30 mEq/L (ref 19–32)
Calcium: 9.3 mg/dL (ref 8.4–10.5)
Chloride: 99 mEq/L (ref 96–112)
Creat: 1.21 mg/dL — ABNORMAL HIGH (ref 0.50–1.10)
Glucose, Bld: 92 mg/dL (ref 70–99)
Potassium: 2.9 mEq/L — ABNORMAL LOW (ref 3.5–5.3)
Sodium: 138 mEq/L (ref 135–145)

## 2014-01-16 ENCOUNTER — Telehealth: Payer: Self-pay | Admitting: Physician Assistant

## 2014-01-16 MED ORDER — POTASSIUM CHLORIDE CRYS ER 20 MEQ PO TBCR: 40.0000 meq | EXTENDED_RELEASE_TABLET | Freq: Two times a day (BID) | ORAL | Status: DC

## 2014-01-16 NOTE — Telephone Encounter (Signed)
Left message for pt to return my call.

## 2014-01-16 NOTE — Telephone Encounter (Signed)
PATIENT CALLED BACK AND I READ HER THE INFORMATION ABOUT HER POTASSIUM AND HER MEDS.  SHE VOICED UNDERSTANDING.

## 2014-01-16 NOTE — Telephone Encounter (Signed)
Potassium level has come up some but only very slightly.  We need to increase supplementation for right now until potassium is in the normal range.  Has she been taking the supplement as directed?  If so, we need to increase to 40 mEq twice daily.  I have sent in a new prescription for the supplement.  She is to take 2 tablets twice daily over the next 2 weeks.  She is supposed to have decreased her Chlorthalidone to 1/2 her former dose.  She is also supposed to schedule a follow-up with Dr. Laury AxonLowne to talk about different options for BP control to prevent low potassium.  Recheck potassium in 1.5-2 weeks at appointment with Dr. Laury AxonLowne. Once potassium is in the normal range, we can lower dose or discontinue medication depending on if her PCP changes her BP medication.

## 2014-01-18 ENCOUNTER — Ambulatory Visit (INDEPENDENT_AMBULATORY_CARE_PROVIDER_SITE_OTHER): Payer: PRIVATE HEALTH INSURANCE | Admitting: Licensed Clinical Social Worker

## 2014-01-18 DIAGNOSIS — IMO0002 Reserved for concepts with insufficient information to code with codable children: Secondary | ICD-10-CM

## 2014-01-26 ENCOUNTER — Other Ambulatory Visit: Payer: Self-pay | Admitting: Family Medicine

## 2014-01-26 ENCOUNTER — Telehealth: Payer: Self-pay | Admitting: Family Medicine

## 2014-01-26 ENCOUNTER — Ambulatory Visit (INDEPENDENT_AMBULATORY_CARE_PROVIDER_SITE_OTHER): Payer: 59 | Admitting: Medical

## 2014-01-26 ENCOUNTER — Encounter: Payer: Self-pay | Admitting: Medical

## 2014-01-26 VITALS — BP 96/64 | HR 72 | Temp 98.2°F | Wt 188.0 lb

## 2014-01-26 DIAGNOSIS — I959 Hypotension, unspecified: Secondary | ICD-10-CM

## 2014-01-26 DIAGNOSIS — E876 Hypokalemia: Secondary | ICD-10-CM

## 2014-01-26 NOTE — Telephone Encounter (Signed)
Last seen 05/29/13 and filled 10/26/13 #30 with 2 refills.    UDS low risk  Please advise  KP

## 2014-01-26 NOTE — Telephone Encounter (Signed)
Please advise      KP 

## 2014-01-26 NOTE — Telephone Encounter (Signed)
Caller name: Lonia ChimeraRebecca Kallam Relation to pt: self Call back number: Pharmacy:  Reason for call: pt is feeling Nausea from potassium chloride SA (K-DUR,KLOR-CON) 20 MEQ tablet and would like to discuss another medication.

## 2014-01-26 NOTE — Patient Instructions (Addendum)
For your bp I did talk with Dr. Laury Axon and we decided to stop your bp medication. Stop your potassium tabs as well  but do eat banana every day. Get lab done today. Will decide  if needs further supplementation of potasssium. Do get bp cuff and start checking bp daily. If your bp rises near or above 140/90 we could use other medication class. Follow up 7 days or as needed.

## 2014-01-26 NOTE — Progress Notes (Signed)
   Subjective:    Patient ID: Lonia ChimeraRebecca Govoni, female    DOB: 08-17-1965, 48 y.o.   MRN: [id number]  HPI  Pt her for follow up on her low k. Pt is on chlorthalidone 25 mg for past 2 wks. Pt bp has been low the last 4-6 visits. Pt k has been supplemented and increased but her k level only came up minimally. No chest pain or palpitation reported. She does feel fatigued. Patient describes that she could not tolerate the potassium. Describes that it made her ill nauseous.  Prior gynecologist put her on chlorthalidone and she has not been on anytthing else.     Review of Systems  Constitutional: Positive for fatigue. Negative for fever and chills.       Does describe feeling fatigued. Note I did review her last CBC in July and she was not anemic.  Respiratory: Negative for cough, chest tightness and wheezing.   Cardiovascular: Negative for chest pain and palpitations.  Gastrointestinal: Positive for nausea. Negative for vomiting, abdominal pain, diarrhea, constipation, blood in stool and abdominal distention.       Shortly after taking potassium would feel nauseous.  Musculoskeletal: Positive for neck pain. Negative for arthralgias, myalgias and neck stiffness.       No muscle cramps.  Hematological: Negative for adenopathy. Does not bruise/bleed easily.       Objective:   Physical Exam  General Mental Status- Alert. General Appearance- Not in acute distress.  Skin General:-Color-Normal Color. Moisture- Normal Moisture.  Neck Carotid Arteries- normal upstroke and runoff. No JVD. No bruits.  Chest and Lung Exam Auscultation:Rhythm- Regular, rate and rhythm. Murmurs & Other Heart Sounds: Auscultation of the heart reveals- No murmurs.  Lungs Clear even and unlabored bilaterally.  Neurologic Cranial nerves III through XII grossly intact. No gross motor or sensory function deficits    .        Assessment & Plan:

## 2014-01-26 NOTE — Progress Notes (Signed)
Pre visit review using our clinic review tool, if applicable. No additional management support is needed unless otherwise documented below in the visit note. 

## 2014-01-26 NOTE — Telephone Encounter (Signed)
Spoke with patient and she thinks it is more than the potassium making her sick, she said she is having HA, Nausea and thinks it is her BP medication. She wanted to come in, Apt scheduled with Ramon DredgeEdward today.     KP

## 2014-01-26 NOTE — Telephone Encounter (Signed)
We can make it a smaller dose 10 meq and take it 2 x a day or change to powder she can mix with water or take it later in day.

## 2014-01-27 DIAGNOSIS — E876 Hypokalemia: Secondary | ICD-10-CM | POA: Insufficient documentation

## 2014-01-27 DIAGNOSIS — I959 Hypotension, unspecified: Secondary | ICD-10-CM | POA: Insufficient documentation

## 2014-01-27 LAB — BASIC METABOLIC PANEL
BUN: 15 mg/dL (ref 6–23)
CHLORIDE: 101 meq/L (ref 96–112)
CO2: 30 meq/L (ref 19–32)
Calcium: 9.4 mg/dL (ref 8.4–10.5)
Creat: 1.15 mg/dL — ABNORMAL HIGH (ref 0.50–1.10)
Glucose, Bld: 94 mg/dL (ref 70–99)
POTASSIUM: 3.6 meq/L (ref 3.5–5.3)
Sodium: 142 mEq/L (ref 135–145)

## 2014-01-27 NOTE — Assessment & Plan Note (Signed)
Patient has a history of hypertension but recently low blood pressures documented. She is on a diuretic and her potassium has also been low. After discussion with Dr. Laury AxonLowne decision made to discontinue blood pressure medication/chlorthalidone and discontinue potassium. We'll get a BMP today and patient is advised to check her blood pressure daily. Advised to get a blood pressure monitor. I explained to her that if her blood pressure is increasing  higher than 140/90 then we may try a medication such as an ACE inhibitor. Will follow potassium on blood work. Patient advised during the interim to eat a banana every day

## 2014-01-27 NOTE — Assessment & Plan Note (Signed)
Likely cause is the diuretic and that will be stopped today. Follow labs on labs and eat a banana a day pending lab results. She will stop that K. Dur temporarily until advised that on lab results.

## 2014-01-29 NOTE — Telephone Encounter (Signed)
Call from patient and the pharmacy had not received the Klonopin. I called in the Rx to the pharmacist and made the patient aware.      KP

## 2014-02-13 ENCOUNTER — Ambulatory Visit: Payer: PRIVATE HEALTH INSURANCE | Admitting: Licensed Clinical Social Worker

## 2014-03-12 ENCOUNTER — Encounter: Payer: Self-pay | Admitting: Family Medicine

## 2014-03-12 ENCOUNTER — Ambulatory Visit (INDEPENDENT_AMBULATORY_CARE_PROVIDER_SITE_OTHER): Payer: 59 | Admitting: Family Medicine

## 2014-03-12 VITALS — BP 103/73 | HR 69 | Temp 98.5°F | Wt 187.4 lb

## 2014-03-12 DIAGNOSIS — J069 Acute upper respiratory infection, unspecified: Secondary | ICD-10-CM

## 2014-03-12 DIAGNOSIS — I10 Essential (primary) hypertension: Secondary | ICD-10-CM

## 2014-03-12 DIAGNOSIS — J029 Acute pharyngitis, unspecified: Secondary | ICD-10-CM

## 2014-03-12 MED ORDER — CHLORTHALIDONE 25 MG PO TABS: 25.0000 mg | ORAL_TABLET | Freq: Every day | ORAL | Status: DC

## 2014-03-12 MED ORDER — POTASSIUM CHLORIDE CRYS ER 20 MEQ PO TBCR: 20.0000 meq | EXTENDED_RELEASE_TABLET | Freq: Every day | ORAL | Status: DC

## 2014-03-12 NOTE — Progress Notes (Signed)
  Subjective:      Subjective:     Kaylee Gray is a 48 y.o. female who presents for evaluation of symptoms of a URI. Symptoms include congestion, nasal congestion and post nasal drip and sore throat. Onset of symptoms was 3 days ago, and has been gradually worsening since that time. Treatment to date: antihistamines.  The following portions of the patient's history were reviewed and updated as appropriate: allergies, current medications, past family history, past medical history, past social history, past surgical history and problem list.  Review of Systems Pertinent items are noted in HPI.   Objective:    BP 103/73  Pulse 69  Temp(Src) 98.5 F (36.9 C) (Oral)  Wt 187 lb 6.3 oz (85 kg)  SpO2 100% General appearance: alert, cooperative, appears stated age and no distress Ears: normal TM's and external ear canals both ears Nose: Nares normal. Septum midline. Mucosa normal. No drainage or sinus tenderness., clear discharge, mild congestion, turbinates red, swollen Throat: lips, mucosa, and tongue normal; teeth and gums normal Neck: no adenopathy, no carotid bruit, no JVD, supple, symmetrical, trachea midline and thyroid not enlarged, symmetric, no tenderness/mass/nodules Lungs: clear to auscultation bilaterally Heart: S1, S2 normal   Assessment:    viral upper respiratory illness   Plan:    Discussed diagnosis and treatment of URI. Suggested symptomatic OTC remedies. Nasal saline spray for congestion. Nasal steroids per orders. Follow up as needed.   1. Essential hypertension Recheck 2-3 weeks - chlorthalidone (HYGROTON) 25 MG tablet; Take 1 tablet (25 mg total) by mouth daily.  Dispense: 30 tablet; Refill: 2 - potassium chloride SA (K-DUR,KLOR-CON) 20 MEQ tablet; Take 1 tablet (20 mEq total) by mouth daily.  Dispense: 30 tablet; Refill: 2  2. Acute upper respiratory infections of unspecified site    3. Sore throat   - Throat culture Loney Loh)

## 2014-03-12 NOTE — Patient Instructions (Signed)
Upper Respiratory Infection, Adult An upper respiratory infection (URI) is also known as the common cold. It is often caused by a type of germ (virus). Colds are easily spread (contagious). You can pass it to others by kissing, coughing, sneezing, or drinking out of the same glass. Usually, you get better in 1 or 2 weeks.  HOME CARE   Only take medicine as told by your doctor.  Use a warm mist humidifier or breathe in steam from a hot shower.  Drink enough water and fluids to keep your pee (urine) clear or pale yellow.  Get plenty of rest.  Return to work when your temperature is back to normal or as told by your doctor. You may use a face mask and wash your hands to stop your cold from spreading. GET HELP RIGHT AWAY IF:   After the first few days, you feel you are getting worse.  You have questions about your medicine.  You have chills, shortness of breath, or brown or red spit (mucus).  You have yellow or brown snot (nasal discharge) or pain in the face, especially when you bend forward.  You have a fever, puffy (swollen) neck, pain when you swallow, or white spots in the back of your throat.  You have a bad headache, ear pain, sinus pain, or chest pain.  You have a high-pitched whistling sound when you breathe in and out (wheezing).  You have a lasting cough or cough up blood.  You have sore muscles or a stiff neck. MAKE SURE YOU:   Understand these instructions.  Will watch your condition.  Will get help right away if you are not doing well or get worse. Document Released: 11/25/2007 Document Revised: 08/31/2011 Document Reviewed: 09/13/2013 [employer] Patient Information 2015 [employer]. This information is not intended to replace advice given to you by your health care provider. Make sure you discuss any questions you have with your health care provider.  

## 2014-03-12 NOTE — Progress Notes (Signed)
Pre visit review using our clinic review tool, if applicable. No additional management support is needed unless otherwise documented below in the visit note. 

## 2014-03-14 LAB — CULTURE, GROUP A STREP: Organism ID, Bacteria: NORMAL

## 2014-03-21 ENCOUNTER — Telehealth: Payer: Self-pay | Admitting: Physician Assistant

## 2014-03-21 ENCOUNTER — Ambulatory Visit (INDEPENDENT_AMBULATORY_CARE_PROVIDER_SITE_OTHER): Payer: 59 | Admitting: Physician Assistant

## 2014-03-21 ENCOUNTER — Other Ambulatory Visit (HOSPITAL_COMMUNITY)
Admission: RE | Admit: 2014-03-21 | Discharge: 2014-03-21 | Disposition: A | Discharge status: Home / Self Care | Payer: 59 | Source: Ambulatory Visit | Attending: Physician Assistant | Admitting: Physician Assistant

## 2014-03-21 ENCOUNTER — Encounter: Payer: Self-pay | Admitting: Physician Assistant

## 2014-03-21 VITALS — BP 112/74 | HR 83 | Temp 98.5°F | Resp 16 | Ht 64.0 in | Wt 187.0 lb

## 2014-03-21 DIAGNOSIS — N760 Acute vaginitis: Secondary | ICD-10-CM | POA: Insufficient documentation

## 2014-03-21 DIAGNOSIS — R399 Unspecified symptoms and signs involving the genitourinary system: Secondary | ICD-10-CM

## 2014-03-21 DIAGNOSIS — N898 Other specified noninflammatory disorders of vagina: Secondary | ICD-10-CM

## 2014-03-21 DIAGNOSIS — I10 Essential (primary) hypertension: Secondary | ICD-10-CM

## 2014-03-21 DIAGNOSIS — Z113 Encounter for screening for infections with a predominantly sexual mode of transmission: Secondary | ICD-10-CM | POA: Insufficient documentation

## 2014-03-21 DIAGNOSIS — R3989 Other symptoms and signs involving the genitourinary system: Secondary | ICD-10-CM

## 2014-03-21 DIAGNOSIS — E876 Hypokalemia: Secondary | ICD-10-CM

## 2014-03-21 DIAGNOSIS — J209 Acute bronchitis, unspecified: Secondary | ICD-10-CM

## 2014-03-21 LAB — POCT URINALYSIS DIPSTICK
Bilirubin, UA: NEGATIVE
Blood, UA: NEGATIVE
GLUCOSE UA: NEGATIVE
KETONES UA: NEGATIVE
Nitrite, UA: NEGATIVE
Protein, UA: NEGATIVE
SPEC GRAV UA: 1.025
UROBILINOGEN UA: 0.2
pH, UA: 5

## 2014-03-21 LAB — CBC WITH DIFFERENTIAL/PLATELET
BASOS PCT: 0.5 % (ref 0.0–3.0)
Basophils Absolute: 0 10*3/uL (ref 0.0–0.1)
Eosinophils Absolute: 0.3 10*3/uL (ref 0.0–0.7)
Eosinophils Relative: 2.8 % (ref 0.0–5.0)
HCT: 38.9 % (ref 36.0–46.0)
HEMOGLOBIN: 13 g/dL (ref 12.0–15.0)
LYMPHS ABS: 2 10*3/uL (ref 0.7–4.0)
Lymphocytes Relative: 20.6 % (ref 12.0–46.0)
MCHC: 33.5 g/dL (ref 30.0–36.0)
MCV: 83.3 fl (ref 78.0–100.0)
MONOS PCT: 6.4 % (ref 3.0–12.0)
Monocytes Absolute: 0.6 10*3/uL (ref 0.1–1.0)
Neutro Abs: 6.8 10*3/uL (ref 1.4–7.7)
Neutrophils Relative %: 69.7 % (ref 43.0–77.0)
Platelets: 309 10*3/uL (ref 150.0–400.0)
RBC: 4.66 Mil/uL (ref 3.87–5.11)
RDW: 14.9 % (ref 11.5–15.5)
WBC: 9.8 10*3/uL (ref 4.0–10.5)

## 2014-03-21 LAB — BASIC METABOLIC PANEL
BUN: 16 mg/dL (ref 6–23)
CALCIUM: 9.1 mg/dL (ref 8.4–10.5)
CO2: 29 meq/L (ref 19–32)
Chloride: 104 mEq/L (ref 96–112)
Creatinine, Ser: 1.2 mg/dL (ref 0.4–1.2)
GFR: 52.47 mL/min — AB (ref >60.00)
GLUCOSE: 94 mg/dL (ref 70–99)
Potassium: 2.8 mEq/L — CL (ref 3.5–5.1)
Sodium: 139 mEq/L (ref 135–145)

## 2014-03-21 MED ORDER — POTASSIUM CHLORIDE CRYS ER 20 MEQ PO TBCR: EXTENDED_RELEASE_TABLET | ORAL | Status: DC

## 2014-03-21 MED ORDER — HYDROCOD POLST-CHLORPHEN POLST 10-8 MG/5ML PO LQCR: 5.0000 mL | Freq: Two times a day (BID) | ORAL | Status: DC | PRN

## 2014-03-21 MED ORDER — FLUTICASONE PROPIONATE 50 MCG/ACT NA SUSP: 2.0000 | Freq: Every day | NASAL | Status: DC

## 2014-03-21 MED ORDER — LISINOPRIL 10 MG PO TABS: 10.0000 mg | ORAL_TABLET | Freq: Every day | ORAL | Status: DC

## 2014-03-21 MED ORDER — AZITHROMYCIN 250 MG PO TABS: ORAL_TABLET | ORAL | Status: DC

## 2014-03-21 NOTE — Assessment & Plan Note (Signed)
Denies sexual activity.  UA unremarkable for infection. Ancillary testing performed to assess etiology of discharge.

## 2014-03-21 NOTE — Progress Notes (Signed)
Patient presents to clinic today c/o congestion, productive cough, nausea and fatigue over the past 1-2 weeks.  Endorses some vomiting post coughing spells.  Patient denies fever, chills, pleuritic chest pain and SOB.  Is concerned about emesis as she has a history of hypokalemia.  Is currently on 20 mEq of K-Dur.  Was recently restarted on Chlorthalidone by her PCP.    Past Medical History  Diagnosis Date  . Depression   . Hypertension   . Frequent headaches     Current Outpatient Prescriptions on File Prior to Visit  Medication Sig Dispense Refill  . clonazePAM (KLONOPIN) 1 MG tablet TAKE ONE TABLET BY MOUTH EVERY DAY  30 tablet  2  . escitalopram (LEXAPRO) 20 MG tablet TAKE 1 TABLET (20 MG TOTAL) BY MOUTH DAILY.  90 tablet  1   No current facility-administered medications on file prior to visit.    No Known Allergies  Family History  Problem Relation Age of Onset  . COPD Mother   . COPD Father   . Emphysema Mother     Smoker  . Emphysema Father     Smoker    History   Social History  . Marital Status: Divorced    Spouse Name: N/A    Number of Children: N/A  . Years of Education: N/A   Social History Main Topics  . Smoking status: Never Smoker   . Smokeless tobacco: Never Used  . Alcohol Use: No  . Drug Use: No  . Sexual Activity: None   Other Topics Concern  . None   Social History Narrative  . None    Review of Systems - See HPI.  All other ROS are negative.  BP 112/74  Pulse 83  Temp(Src) 98.5 F (36.9 C) (Oral)  Resp 16  Ht _0  (1.626 m)  Wt 187 lb (84.823 kg)  BMI 32.08 kg/m2  SpO2 98%  Physical Exam  Vitals reviewed. Constitutional: She is oriented to person, place, and time and well-developed, well-nourished, and in no distress.  HENT:  Head: Normocephalic and atraumatic.  Right Ear: External ear and ear canal normal. A middle ear effusion is present.  Left Ear: External ear and ear canal normal. Tympanic membrane is erythematous and  bulging. A middle ear effusion is present.  Nose: Nose normal.  Mouth/Throat: Uvula is midline, oropharynx is clear and moist and mucous membranes are normal.  Eyes: Conjunctivae are normal.  Neck: Neck supple.  Cardiovascular: Normal rate, regular rhythm, normal heart sounds and intact distal pulses.   Pulmonary/Chest: Effort normal and breath sounds normal. No respiratory distress. She has no wheezes. She has no rales. She exhibits no tenderness.  Lymphadenopathy:    She has no cervical adenopathy.  Neurological: She is alert and oriented to person, place, and time.  Skin: Skin is warm and dry. No rash noted.  Psychiatric: Affect normal.   Recent Results (from the past 2160 hour(s))  CBC     Status: None   Collection Time    01/04/14  3:33 PM      Result Value Ref Range   WBC 7.8  4.0 - 10.5 K/uL   RBC 4.88  3.87 - 5.11 MIL/uL   Hemoglobin 13.4  12.0 - 15.0 g/dL   HCT 38.1  36.0 - 46.0 %   MCV 78.1  78.0 - 100.0 fL   MCH 27.5  26.0 - 34.0 pg   MCHC 35.2  30.0 - 36.0 g/dL   RDW 14.7  11.5 - 15.5 %   Platelets 382  150 - 400 K/uL  DIFFERENTIAL     Status: None   Collection Time    01/04/14  3:33 PM      Result Value Ref Range   Neutrophils Relative % 58  43 - 77 %   Neutro Abs 4.5  1.7 - 7.7 K/uL   Lymphocytes Relative 32  12 - 46 %   Lymphs Abs 2.5  0.7 - 4.0 K/uL   Monocytes Relative 7  3 - 12 %   Monocytes Absolute 0.5  0.1 - 1.0 K/uL   Eosinophils Relative 2  0 - 5 %   Eosinophils Absolute 0.2  0.0 - 0.7 K/uL   Basophils Relative 1  0 - 1 %   Basophils Absolute 0.1  0.0 - 0.1 K/uL   Smear Review Criteria for review not met    BASIC METABOLIC PANEL     Status: Abnormal   Collection Time    01/04/14  3:33 PM      Result Value Ref Range   Sodium 139  135 - 145 mEq/L   Potassium 2.7 (*) 3.5 - 5.3 mEq/L   Comment: Result repeated and verified.   Chloride 97  96 - 112 mEq/L   CO2 31  19 - 32 mEq/L   Glucose, Bld 88  70 - 99 mg/dL   BUN 13  6 - 23 mg/dL   Creat 1.13  (*) 0.50 - 1.10 mg/dL   Calcium 9.1  8.4 - 10.5 mg/dL  BASIC METABOLIC PANEL     Status: Abnormal   Collection Time    01/15/14  2:42 PM      Result Value Ref Range   Sodium 138  135 - 145 mEq/L   Potassium 2.9 (*) 3.5 - 5.3 mEq/L   Chloride 99  96 - 112 mEq/L   CO2 30  19 - 32 mEq/L   Glucose, Bld 92  70 - 99 mg/dL   BUN 16  6 - 23 mg/dL   Creat 1.21 (*) 0.50 - 1.10 mg/dL   Calcium 9.3  8.4 - 10.5 mg/dL  BASIC METABOLIC PANEL     Status: Abnormal   Collection Time    01/26/14  3:56 PM      Result Value Ref Range   Sodium 142  135 - 145 mEq/L   Potassium 3.6  3.5 - 5.3 mEq/L   Chloride 101  96 - 112 mEq/L   CO2 30  19 - 32 mEq/L   Glucose, Bld 94  70 - 99 mg/dL   BUN 15  6 - 23 mg/dL   Creat 1.15 (*) 0.50 - 1.10 mg/dL   Calcium 9.4  8.4 - 10.5 mg/dL  CULTURE, GROUP A STREP     Status: None   Collection Time    03/12/14  5:23 PM      Result Value Ref Range   Organism ID, Bacteria Normal Upper Respiratory Flora     Organism ID, Bacteria No Beta Hemolytic Streptococci Isolated    CBC WITH DIFFERENTIAL     Status: None   Collection Time    03/21/14  2:48 PM      Result Value Ref Range   WBC 9.8  4.0 - 10.5 K/uL   RBC 4.66  3.87 - 5.11 Mil/uL   Hemoglobin 13.0  12.0 - 15.0 g/dL   HCT 38.9  36.0 - 46.0 %   MCV 83.3  78.0 - 100.0 fl  MCHC 33.5  30.0 - 36.0 g/dL   RDW 14.9  11.5 - 15.5 %   Platelets 309.0  150.0 - 400.0 K/uL   Neutrophils Relative % 69.7  43.0 - 77.0 %   Lymphocytes Relative 20.6  12.0 - 46.0 %   Monocytes Relative 6.4  3.0 - 12.0 %   Eosinophils Relative 2.8  0.0 - 5.0 %   Basophils Relative 0.5  0.0 - 3.0 %   Neutro Abs 6.8  1.4 - 7.7 K/uL   Lymphs Abs 2.0  0.7 - 4.0 K/uL   Monocytes Absolute 0.6  0.1 - 1.0 K/uL   Eosinophils Absolute 0.3  0.0 - 0.7 K/uL   Basophils Absolute 0.0  0.0 - 0.1 K/uL  BASIC METABOLIC PANEL     Status: Abnormal   Collection Time    03/21/14  2:48 PM      Result Value Ref Range   Sodium 139  135 - 145 mEq/L   Potassium  2.8 (*) 3.5 - 5.1 mEq/L   Chloride 104  96 - 112 mEq/L   CO2 29  19 - 32 mEq/L   Glucose, Bld 94  70 - 99 mg/dL   BUN 16  6 - 23 mg/dL   Creatinine, Ser 1.2  0.4 - 1.2 mg/dL   Calcium 9.1  8.4 - 10.5 mg/dL   GFR 52.47 (*) >60.00 mL/min  POCT URINALYSIS DIPSTICK     Status: None   Collection Time    03/21/14  2:49 PM      Result Value Ref Range   Color, UA Gold     Clarity, UA clear     Comment: Odorous   Glucose, UA neg     Bilirubin, UA neg     Ketones, UA neg     Spec Grav, UA 1.025     Blood, UA neg     pH, UA 5.0     Protein, UA neg     Urobilinogen, UA 0.2     Nitrite, UA neg     Leukocytes, UA small (1+)      Assessment/Plan: Hypokalemia Recheck potassium giving history and emesis.  Continue current dose of K-Dur for now.  Acute bronchitis With AOM.  Rx Azithromycin. Increase fluid.  Rest. Tussionex for cough. Flonase for ear effusion. Follow-up in 1 week.  Vaginal discharge Denies sexual activity.  UA unremarkable for infection. Ancillary testing performed to assess etiology of discharge.

## 2014-03-21 NOTE — Patient Instructions (Signed)
Please continue medications as directed, especially the potassium supplement.  We will change the dose based on your results.    For bronchitis, please take antibiotic as directed.  Increase fluids.  Rest.  Use Tussionex for cough.  Use Flonase to help remove fluid from ear.  Call or return to clinic if symptoms are not improving.  I will call you with your urine results as well.

## 2014-03-21 NOTE — Telephone Encounter (Signed)
Patient informed, understood & agreed; scheduled f/u appt with Esperanza RichtersEdward Saguier, PA on Mon, 10.05.15 at 2:15p/SLS

## 2014-03-21 NOTE — Telephone Encounter (Signed)
Potassium at critical level -- 2.8.  Needs to increase her potassium to 40 meq twice daily for two days (2 tablets twice daily), then decrease to 1 tablet twice daily.  STOP Chlorthalidone.  Begin lisinopril 10 mg daily for BP.  Follow-up in office on Monday.

## 2014-03-21 NOTE — Assessment & Plan Note (Signed)
Recheck potassium giving history and emesis.  Continue current dose of K-Dur for now.

## 2014-03-21 NOTE — Progress Notes (Signed)
Pre visit review using our clinic review tool, if applicable. No additional management support is needed unless otherwise documented below in the visit note/SLS  

## 2014-03-21 NOTE — Assessment & Plan Note (Signed)
With AOM.  Rx Azithromycin. Increase fluid.  Rest. Tussionex for cough. Flonase for ear effusion. Follow-up in 1 week.

## 2014-03-22 LAB — CULTURE, URINE COMPREHENSIVE
COLONY COUNT: NO GROWTH
Organism ID, Bacteria: NO GROWTH

## 2014-03-23 LAB — URINE CYTOLOGY ANCILLARY ONLY
Chlamydia: NEGATIVE
Neisseria Gonorrhea: NEGATIVE
TRICH (WINDOWPATH): NEGATIVE

## 2014-03-26 ENCOUNTER — Encounter: Payer: Self-pay | Admitting: Medical

## 2014-03-26 ENCOUNTER — Ambulatory Visit (HOSPITAL_BASED_OUTPATIENT_CLINIC_OR_DEPARTMENT_OTHER)
Admission: RE | Admit: 2014-03-26 | Discharge: 2014-03-26 | Disposition: A | Discharge status: Home / Self Care | Payer: 59 | Source: Ambulatory Visit | Attending: Medical | Admitting: Medical

## 2014-03-26 ENCOUNTER — Ambulatory Visit (INDEPENDENT_AMBULATORY_CARE_PROVIDER_SITE_OTHER): Payer: 59 | Admitting: Medical

## 2014-03-26 VITALS — BP 110/73 | HR 62 | Temp 99.0°F | Ht 63.5 in | Wt 193.6 lb

## 2014-03-26 DIAGNOSIS — J208 Acute bronchitis due to other specified organisms: Secondary | ICD-10-CM

## 2014-03-26 DIAGNOSIS — I10 Essential (primary) hypertension: Secondary | ICD-10-CM | POA: Insufficient documentation

## 2014-03-26 DIAGNOSIS — E876 Hypokalemia: Secondary | ICD-10-CM

## 2014-03-26 DIAGNOSIS — R05 Cough: Secondary | ICD-10-CM | POA: Diagnosis present

## 2014-03-26 DIAGNOSIS — H6500 Acute serous otitis media, unspecified ear: Secondary | ICD-10-CM

## 2014-03-26 DIAGNOSIS — R0602 Shortness of breath: Secondary | ICD-10-CM | POA: Diagnosis not present

## 2014-03-26 DIAGNOSIS — H6690 Otitis media, unspecified, unspecified ear: Secondary | ICD-10-CM

## 2014-03-26 DIAGNOSIS — H6691 Otitis media, unspecified, right ear: Secondary | ICD-10-CM

## 2014-03-26 DIAGNOSIS — R509 Fever, unspecified: Secondary | ICD-10-CM | POA: Diagnosis not present

## 2014-03-26 MED ORDER — CEFTRIAXONE SODIUM 1 G IJ SOLR
1.0000 g | Freq: Once | INTRAMUSCULAR | Status: AC
  Administered 2014-03-26: 1 g via INTRAMUSCULAR

## 2014-03-26 MED ORDER — ALBUTEROL SULFATE HFA 108 (90 BASE) MCG/ACT IN AERS: 2.0000 | INHALATION_SPRAY | Freq: Four times a day (QID) | RESPIRATORY_TRACT | Status: DC | PRN

## 2014-03-26 NOTE — Progress Notes (Signed)
Subjective:    Patient ID: Kaylee Gray, female    DOB: [birth year], 48 y.o.   MRN: [id number]  HPI  Pt is as been on k for low potassium. She was on k- dur  2 tablets for 2 days then to just 2 tablets. Essentially pt  was on  80 meq for 2 days and now on 40 meq q day. Pt was on chlorthaldione in the past. She was on 50 mg in past then decreased to 25 mg.  Now just on lisinipril. Her bp is well controlled.   Pt in stating she is coughing with chest congestion and left ear pain.Pt given azithromycin and tussionex on last visit with provider here. Pt is occasionally wheezing. Pt does not smoke. Pt is has some chills and sweating this weekend. Stats feeling ill.   Past Medical History  Diagnosis Date  . Depression   . Hypertension   . Frequent headaches     History   Social History  . Marital Status: Divorced    Spouse Name: N/A    Number of Children: N/A  . Years of Education: N/A   Occupational History  . Not on file.   Social History Main Topics  . Smoking status: Never Smoker   . Smokeless tobacco: Never Used  . Alcohol Use: No  . Drug Use: No  . Sexual Activity: Not on file   Other Topics Concern  . Not on file   Social History Narrative  . No narrative on file    Past Surgical History  Procedure Laterality Date  . Tubal ligation    . Cesarean section    . Abdominal hysterectomy  2007    endometriosis,  TAH BSO    Family History  Problem Relation Age of Onset  . COPD Mother   . COPD Father   . Emphysema Mother     Smoker  . Emphysema Father     Smoker    No Known Allergies  Current Outpatient Prescriptions on File Prior to Visit  Medication Sig Dispense Refill  . chlorpheniramine-HYDROcodone (TUSSIONEX PENNKINETIC ER) 10-8 MG/5ML LQCR Take 5 mLs by mouth every 12 (twelve) hours as needed.  115 mL  0  . clonazePAM (KLONOPIN) 1 MG tablet TAKE ONE TABLET BY MOUTH EVERY DAY  30 tablet  2  . escitalopram (LEXAPRO) 20 MG tablet TAKE 1 TABLET (20 MG  TOTAL) BY MOUTH DAILY.  90 tablet  1  . fluticasone (FLONASE) 50 MCG/ACT nasal spray Place 2 sprays into both nostrils daily.  16 g  6  . lisinopril (PRINIVIL,ZESTRIL) 10 MG tablet Take 1 tablet (10 mg total) by mouth daily.  30 tablet  0  . potassium chloride SA (K-DUR,KLOR-CON) 20 MEQ tablet Take 2 tablets twice daily for 2 days, then decrease to 2 tablets daily.  30 tablet  0  . azithromycin (ZITHROMAX Z-PAK) 250 MG tablet Take 2 tablets on Day 1.  Then take 1 tablet daily.  6 tablet  0   No current facility-administered medications on file prior to visit.    BP 110/73  Pulse 62  Temp(Src) 99 F (37.2 C) (Oral)  Ht 5' 3.5" (1.613 m)  Wt 193 lb 9.6 oz (87.816 kg)  BMI 33.75 kg/m2  SpO2 97%          Review of Systems  Constitutional: Positive for fatigue. Negative for fever and chills.  HENT: Positive for congestion and ear pain. Negative for postnasal drip, sinus pressure, sneezing and sore  throat.        Rt ear pain.  Respiratory: Positive for cough. Negative for choking, chest tightness, shortness of breath and wheezing.   Cardiovascular: Negative for chest pain and palpitations.  Gastrointestinal: Negative.   Genitourinary: Negative.   Musculoskeletal: Negative for back pain, joint swelling and neck pain.  Skin: Negative.   Neurological: Negative.   Hematological: Negative for adenopathy. Does not bruise/bleed easily.  Psychiatric/Behavioral: Negative.        Objective:   Physical Exam  Constitutional: She is oriented to person, place, and time. She appears well-developed and well-nourished. No distress.  Looks bid tired. But no acute distress. Sounds nasal congested.  HENT:  Head: Normocephalic and atraumatic.  Left Ear: External ear normal.  Nose: Nose normal.  Mouth/Throat: No oropharyngeal exudate.  Rt tm dull red.  Eyes: Conjunctivae and EOM are normal. Pupils are equal, round, and reactive to light. Left eye exhibits no discharge. No scleral icterus.    Neck: Normal range of motion. Neck supple. No JVD present. No tracheal deviation present. No thyromegaly present.  Cardiovascular: Normal rate, regular rhythm and normal heart sounds.  Exam reveals no gallop and no friction rub.   No murmur heard. Pulmonary/Chest: Effort normal. No stridor. No respiratory distress. She has no wheezes. She has no rales. She exhibits no tenderness.  Abdominal: Soft. Bowel sounds are normal. She exhibits no distension and no mass. There is no tenderness. There is no rebound and no guarding.  Musculoskeletal: She exhibits no edema and no tenderness.  Lymphadenopathy:    She has no cervical adenopathy.  Neurological: She is alert and oriented to person, place, and time. No cranial nerve deficit. Coordination normal.  Skin: She is not diaphoretic.  Psychiatric: She has a normal mood and affect. Her behavior is normal. Judgment and thought content normal.           Assessment & Plan:

## 2014-03-26 NOTE — Assessment & Plan Note (Addendum)
Pt finished her azithromycin. She is on tussionex. She will continue for cough. Did give rocephin 1 gram since still some symptoms persisting and rt tm still red. Did cxr to r/o out any pneumonia.

## 2014-03-26 NOTE — Assessment & Plan Note (Signed)
Pt finished azithromycin and tm still red. So gave rocephin 1 gram.

## 2014-03-26 NOTE — Assessment & Plan Note (Signed)
Will repeat cmp today.Follow k and then decide on if k-dur dosage needs adjustment.

## 2014-03-26 NOTE — Patient Instructions (Signed)
For your persisting bronchitis and rt om, we gave you rocephin 1 gram im. Also I want to get a cxr and r/o pneumonia since your symptoms are persisting.  For your occasional wheezing, I am prescribing albuterol inhaler.  For your recent low k, I am ordering cmp. Cbc will be added to assess if you have elevated wbc with bronchitis and rt om.  Follow up in 7 dys or as needed.

## 2014-03-27 ENCOUNTER — Telehealth: Payer: Self-pay | Admitting: Family Medicine

## 2014-03-27 DIAGNOSIS — I10 Essential (primary) hypertension: Secondary | ICD-10-CM

## 2014-03-27 DIAGNOSIS — E876 Hypokalemia: Secondary | ICD-10-CM

## 2014-03-27 LAB — URINE CYTOLOGY ANCILLARY ONLY
Bacterial vaginitis: NEGATIVE
CANDIDA VAGINITIS: NEGATIVE

## 2014-03-27 LAB — CBC WITH DIFFERENTIAL/PLATELET
BASOS ABS: 0 10*3/uL (ref 0.0–0.1)
Basophils Relative: 0.5 % (ref 0.0–3.0)
EOS PCT: 4.1 % (ref 0.0–5.0)
Eosinophils Absolute: 0.3 10*3/uL (ref 0.0–0.7)
HEMATOCRIT: 37 % (ref 36.0–46.0)
Hemoglobin: 12.2 g/dL (ref 12.0–15.0)
Lymphocytes Relative: 30.2 % (ref 12.0–46.0)
Lymphs Abs: 2.1 10*3/uL (ref 0.7–4.0)
MCHC: 33 g/dL (ref 30.0–36.0)
MCV: 85.5 fl (ref 78.0–100.0)
MONO ABS: 0.5 10*3/uL (ref 0.1–1.0)
Monocytes Relative: 7.2 % (ref 3.0–12.0)
NEUTROS PCT: 58 % (ref 43.0–77.0)
Neutro Abs: 4.1 10*3/uL (ref 1.4–7.7)
PLATELETS: 318 10*3/uL (ref 150.0–400.0)
RBC: 4.33 Mil/uL (ref 3.87–5.11)
RDW: 15.4 % (ref 11.5–15.5)
WBC: 7.1 10*3/uL (ref 4.0–10.5)

## 2014-03-27 LAB — COMPREHENSIVE METABOLIC PANEL
ALBUMIN: 3.6 g/dL (ref 3.5–5.2)
ALT: 21 U/L (ref 0–35)
AST: 19 U/L (ref 0–37)
Alkaline Phosphatase: 63 U/L (ref 39–117)
BUN: 14 mg/dL (ref 6–23)
CO2: 29 mEq/L (ref 19–32)
Calcium: 9 mg/dL (ref 8.4–10.5)
Chloride: 107 mEq/L (ref 96–112)
Creatinine, Ser: 1.3 mg/dL — ABNORMAL HIGH (ref 0.4–1.2)
GFR: 46.46 mL/min — ABNORMAL LOW (ref >60.00)
GLUCOSE: 81 mg/dL (ref 70–99)
POTASSIUM: 3.8 meq/L (ref 3.5–5.1)
SODIUM: 143 meq/L (ref 135–145)
TOTAL PROTEIN: 7.3 g/dL (ref 6.0–8.3)
Total Bilirubin: 0.3 mg/dL (ref 0.2–1.2)

## 2014-03-27 MED ORDER — POTASSIUM CHLORIDE CRYS ER 20 MEQ PO TBCR: 20.0000 meq | EXTENDED_RELEASE_TABLET | Freq: Once | ORAL | Status: DC

## 2014-03-27 NOTE — Telephone Encounter (Signed)
Rx k-dur. Hyokalemia resolved but took quite a high dose so will keep on low dose for 3 wks and check level in 3 wks.

## 2014-03-27 NOTE — Telephone Encounter (Signed)
Caller name: [name] Relation to pt: self Call back number: [phone] Pharmacy:   Reason for call:   Patient is requesting potassium results

## 2014-03-27 NOTE — Telephone Encounter (Signed)
The patient saw Ramon Dredgedward. Please advise    KP

## 2014-03-27 NOTE — Progress Notes (Signed)
Called patient with CXR results,advised that the was no evidence of pneumonia. Left message on answering machine.

## 2014-03-28 NOTE — Telephone Encounter (Signed)
Pt would like to know the status of her potassium results

## 2014-03-28 NOTE — Telephone Encounter (Signed)
Called patient. Reviewed lab results. Advised patient to return in 3 weeks to re-check Potassium levels. Patient agreed.

## 2014-03-29 ENCOUNTER — Telehealth: Payer: Self-pay | Admitting: Family Medicine

## 2014-03-29 MED ORDER — ESCITALOPRAM OXALATE 20 MG PO TABS: ORAL_TABLET | ORAL | Status: DC

## 2014-03-29 NOTE — Telephone Encounter (Signed)
Caller name: [name]  Relation to pt: self  Call back number: [phone] Pharmacy: [facility] [phone]   Reason for call: pt requesting a refill escitalopram (LEXAPRO) 20 MG tablet

## 2014-04-03 ENCOUNTER — Ambulatory Visit: Payer: Self-pay | Admitting: Physician Assistant

## 2014-04-12 ENCOUNTER — Ambulatory Visit (INDEPENDENT_AMBULATORY_CARE_PROVIDER_SITE_OTHER): Payer: PRIVATE HEALTH INSURANCE | Admitting: Licensed Clinical Social Worker

## 2014-04-12 DIAGNOSIS — F324 Major depressive disorder, single episode, in partial remission: Secondary | ICD-10-CM

## 2014-04-18 ENCOUNTER — Encounter: Payer: Self-pay | Admitting: Physician Assistant

## 2014-04-18 ENCOUNTER — Ambulatory Visit (INDEPENDENT_AMBULATORY_CARE_PROVIDER_SITE_OTHER): Payer: 59 | Admitting: Physician Assistant

## 2014-04-18 VITALS — BP 103/75 | HR 76 | Temp 98.6°F | Resp 16 | Ht 64.0 in | Wt 189.5 lb

## 2014-04-18 DIAGNOSIS — R053 Chronic cough: Secondary | ICD-10-CM

## 2014-04-18 DIAGNOSIS — R05 Cough: Secondary | ICD-10-CM

## 2014-04-18 MED ORDER — HYDROCOD POLST-CHLORPHEN POLST 10-8 MG/5ML PO LQCR: 5.0000 mL | Freq: Two times a day (BID) | ORAL | Status: DC | PRN

## 2014-04-18 NOTE — Progress Notes (Signed)
Patient presents to clinic today c/o continued non-productive cough.  Patient was seen at the end of last month and treated for bronchitis with Azithromycin.  Was seen on 03/26/14 for continued cough, given Im Rocephin by another provider.  CXR obtained at that time and was within normal limits.  Patient given refill of cough syrup. At present patient denies chest congestion, sinus congestion, sinus pressure, chest tightness or other URI symptoms.  Sole concern is persistent non-productive cough.  Patient placed on Lisinopril 1 week prior to initial symptoms.  Still taking daily for BP.  Past Medical History  Diagnosis Date  . Depression   . Hypertension   . Frequent headaches     Current Outpatient Prescriptions on File Prior to Visit  Medication Sig Dispense Refill  . clonazePAM (KLONOPIN) 1 MG tablet TAKE ONE TABLET BY MOUTH EVERY DAY  30 tablet  2  . escitalopram (LEXAPRO) 20 MG tablet TAKE 1 TABLET (20 MG TOTAL) BY MOUTH DAILY.  90 tablet  1  . fluticasone (FLONASE) 50 MCG/ACT nasal spray Place 2 sprays into both nostrils daily.  16 g  6  . lisinopril (PRINIVIL,ZESTRIL) 10 MG tablet Take 1 tablet (10 mg total) by mouth daily.  30 tablet  0  . potassium chloride SA (K-DUR,KLOR-CON) 20 MEQ tablet Take 1 tablet (20 mEq total) by mouth once.  30 tablet  0   No current facility-administered medications on file prior to visit.    No Known Allergies  Family History  Problem Relation Age of Onset  . COPD Mother   . COPD Father   . Emphysema Mother     Smoker  . Emphysema Father     Smoker    History   Social History  . Marital Status: Divorced    Spouse Name: N/A    Number of Children: N/A  . Years of Education: N/A   Social History Main Topics  . Smoking status: Never Smoker   . Smokeless tobacco: Never Used  . Alcohol Use: No  . Drug Use: No  . Sexual Activity: None   Other Topics Concern  . None   Social History Narrative  . None    Review of Systems - See  HPI.  All other ROS are negative.  BP 103/75  Pulse 76  Temp(Src) 98.6 F (37 C) (Oral)  Resp 16  Ht 5\' 4"  (1.626 m)  Wt 189 lb 8 oz (85.957 kg)  BMI 32.51 kg/m2  SpO2 98%  Physical Exam  Vitals reviewed. Constitutional: She is oriented to person, place, and time and well-developed, well-nourished, and in no distress.  HENT:  Head: Normocephalic and atraumatic.  Right Ear: External ear normal.  Left Ear: External ear normal.  Nose: Nose normal.  Mouth/Throat: Oropharynx is clear and moist. No oropharyngeal exudate.  TM within normal limits bilaterally.  Eyes: Conjunctivae are normal. Pupils are equal, round, and reactive to light.  Neck: Neck supple. No thyromegaly present.  Cardiovascular: Normal rate, regular rhythm, normal heart sounds and intact distal pulses.   Pulmonary/Chest: Effort normal and breath sounds normal. No respiratory distress. She has no wheezes. She has no rales. She exhibits no tenderness.  Lymphadenopathy:    She has no cervical adenopathy.  Neurological: She is alert and oriented to person, place, and time.  Skin: Skin is warm and dry. No rash noted.  Psychiatric: Affect normal.    Recent Results (from the past 2160 hour(s))  BASIC METABOLIC PANEL     Status:  Abnormal   Collection Time    01/26/14  3:56 PM      Result Value Ref Range   Sodium 142  135 - 145 mEq/L   Potassium 3.6  3.5 - 5.3 mEq/L   Chloride 101  96 - 112 mEq/L   CO2 30  19 - 32 mEq/L   Glucose, Bld 94  70 - 99 mg/dL   BUN 15  6 - 23 mg/dL   Creat 1.611.15 (*) 0.960.50 - 1.10 mg/dL   Calcium 9.4  8.4 - 04.510.5 mg/dL  CULTURE, GROUP A STREP     Status: None   Collection Time    03/12/14  5:23 PM      Result Value Ref Range   Organism ID, Bacteria Normal Upper Respiratory Flora     Organism ID, Bacteria No Beta Hemolytic Streptococci Isolated    URINE CYTOLOGY ANCILLARY ONLY     Status: None   Collection Time    03/21/14 12:00 AM      Result Value Ref Range   Chlamydia CT: Negative       Comment: Normal Reference Range - Negative   Neisseria gonorrhea NG: Negative     Comment: Normal Reference Range - Negative   Trich Trichomonas: Negative     Comment: Normal Reference Range - Negative  URINE CYTOLOGY ANCILLARY ONLY     Status: None   Collection Time    03/21/14 12:00 AM      Result Value Ref Range   Bacterial vaginitis Negative for Bacterial Vaginitis Microorganisms     Comment: Normal Reference Range - Negative   Candida vaginitis Negative for Candida Vaginitis Microorganisms     Comment: Normal Reference Range - Negative  CBC WITH DIFFERENTIAL     Status: None   Collection Time    03/21/14  2:48 PM      Result Value Ref Range   WBC 9.8  4.0 - 10.5 K/uL   RBC 4.66  3.87 - 5.11 Mil/uL   Hemoglobin 13.0  12.0 - 15.0 g/dL   HCT 40.938.9  81.136.0 - 91.446.0 %   MCV 83.3  78.0 - 100.0 fl   MCHC 33.5  30.0 - 36.0 g/dL   RDW 78.214.9  95.611.5 - 21.315.5 %   Platelets 309.0  150.0 - 400.0 K/uL   Neutrophils Relative % 69.7  43.0 - 77.0 %   Lymphocytes Relative 20.6  12.0 - 46.0 %   Monocytes Relative 6.4  3.0 - 12.0 %   Eosinophils Relative 2.8  0.0 - 5.0 %   Basophils Relative 0.5  0.0 - 3.0 %   Neutro Abs 6.8  1.4 - 7.7 K/uL   Lymphs Abs 2.0  0.7 - 4.0 K/uL   Monocytes Absolute 0.6  0.1 - 1.0 K/uL   Eosinophils Absolute 0.3  0.0 - 0.7 K/uL   Basophils Absolute 0.0  0.0 - 0.1 K/uL  BASIC METABOLIC PANEL     Status: Abnormal   Collection Time    03/21/14  2:48 PM      Result Value Ref Range   Sodium 139  135 - 145 mEq/L   Potassium 2.8 (*) 3.5 - 5.1 mEq/L   Chloride 104  96 - 112 mEq/L   CO2 29  19 - 32 mEq/L   Glucose, Bld 94  70 - 99 mg/dL   BUN 16  6 - 23 mg/dL   Creatinine, Ser 1.2  0.4 - 1.2 mg/dL   Calcium 9.1  8.4 - 08.610.5  mg/dL   GFR 16.10 (*) >96.04 mL/min  CULTURE, URINE COMPREHENSIVE     Status: None   Collection Time    03/21/14  2:49 PM      Result Value Ref Range   Colony Count NO GROWTH     Organism ID, Bacteria NO GROWTH    POCT URINALYSIS DIPSTICK      Status: None   Collection Time    03/21/14  2:49 PM      Result Value Ref Range   Color, UA Gold     Clarity, UA clear     Comment: Odorous   Glucose, UA neg     Bilirubin, UA neg     Ketones, UA neg     Spec Grav, UA 1.025     Blood, UA neg     pH, UA 5.0     Protein, UA neg     Urobilinogen, UA 0.2     Nitrite, UA neg     Leukocytes, UA small (1+)    COMPREHENSIVE METABOLIC PANEL     Status: Abnormal   Collection Time    03/26/14  3:12 PM      Result Value Ref Range   Sodium 143  135 - 145 mEq/L   Potassium 3.8  3.5 - 5.1 mEq/L   Chloride 107  96 - 112 mEq/L   CO2 29  19 - 32 mEq/L   Glucose, Bld 81  70 - 99 mg/dL   BUN 14  6 - 23 mg/dL   Creatinine, Ser 1.3 (*) 0.4 - 1.2 mg/dL   Total Bilirubin 0.3  0.2 - 1.2 mg/dL   Alkaline Phosphatase 63  39 - 117 U/L   AST 19  0 - 37 U/L   ALT 21  0 - 35 U/L   Total Protein 7.3  6.0 - 8.3 g/dL   Albumin 3.6  3.5 - 5.2 g/dL   Calcium 9.0  8.4 - 54.0 mg/dL   GFR 98.11 (*) >91.47 mL/min  CBC WITH DIFFERENTIAL     Status: None   Collection Time    03/26/14  3:12 PM      Result Value Ref Range   WBC 7.1  4.0 - 10.5 K/uL   RBC 4.33  3.87 - 5.11 Mil/uL   Hemoglobin 12.2  12.0 - 15.0 g/dL   HCT 82.9  56.2 - 13.0 %   MCV 85.5  78.0 - 100.0 fl   MCHC 33.0  30.0 - 36.0 g/dL   RDW 86.5  78.4 - 69.6 %   Platelets 318.0  150.0 - 400.0 K/uL   Neutrophils Relative % 58.0  43.0 - 77.0 %   Lymphocytes Relative 30.2  12.0 - 46.0 %   Monocytes Relative 7.2  3.0 - 12.0 %   Eosinophils Relative 4.1  0.0 - 5.0 %   Basophils Relative 0.5  0.0 - 3.0 %   Neutro Abs 4.1  1.4 - 7.7 K/uL   Lymphs Abs 2.1  0.7 - 4.0 K/uL   Monocytes Absolute 0.5  0.1 - 1.0 K/uL   Eosinophils Absolute 0.3  0.0 - 0.7 K/uL   Basophils Absolute 0.0  0.0 - 0.1 K/uL    Assessment/Plan: Chronic cough Concern for ACEI-induced cough, as PE is unremarkable and symptoms unresponsive to treatment for bronchitis.  Will hold lisinopril over the next week.  DASH diet given.   Tussionex refilled for now.  Patient to follow-up in 2 weeks to assess improvement.  If BP stable  off of medication, will attempt 1-2 month trial of TLC for BP as patient unable to tolerate diuretics and ACEI.  If elevated, will attempt ARB therapy.

## 2014-04-18 NOTE — Progress Notes (Signed)
Pre visit review using our clinic review tool, if applicable. No additional management support is needed unless otherwise documented below in the visit note/SLS  

## 2014-04-18 NOTE — Assessment & Plan Note (Signed)
Concern for ACEI-induced cough, as PE is unremarkable and symptoms unresponsive to treatment for bronchitis.  Will hold lisinopril over the next week.  DASH diet given.  Tussionex refilled for now.  Patient to follow-up in 2 weeks to assess improvement.  If BP stable off of medication, will attempt 1-2 month trial of TLC for BP as patient unable to tolerate diuretics and ACEI.  If elevated, will attempt ARB therapy.

## 2014-04-18 NOTE — Patient Instructions (Signed)
Please stop the Lisinopril, as I think it may be causing your chronic cough.  You should know in a few days if it is a side effect of the medications, because the cough will substantially improve or resolve.  Increase fluids.  Take the Tussionex as directed for severe cough.  Keep a check on your BP at home.  Limit salt intake.  Follow-up in 2 weeks.  DASH Eating Plan DASH stands for "Dietary Approaches to Stop Hypertension." The DASH eating plan is a healthy eating plan that has been shown to reduce high blood pressure (hypertension). Additional health benefits may include reducing the risk of type 2 diabetes mellitus, heart disease, and stroke. The DASH eating plan may also help with weight loss. WHAT DO I NEED TO KNOW ABOUT THE DASH EATING PLAN? For the DASH eating plan, you will follow these general guidelines:  Choose foods with a percent daily value for sodium of less than 5% (as listed on the food label).  Use salt-free seasonings or herbs instead of table salt or sea salt.  Check with your health care provider or pharmacist before using salt substitutes.  Eat lower-sodium products, often labeled as "lower sodium" or "no salt added."  Eat fresh foods.  Eat more vegetables, fruits, and low-fat dairy products.  Choose whole grains. Look for the word "whole" as the first word in the ingredient list.  Choose fish and skinless chicken or [place] more often than red meat. Limit fish, poultry, and meat to 6 oz (170 g) each day.  Limit sweets, desserts, sugars, and sugary drinks.  Choose heart-healthy fats.  Limit cheese to 1 oz (28 g) per day.  Eat more home-cooked food and less restaurant, buffet, and fast food.  Limit fried foods.  Cook foods using methods other than frying.  Limit canned vegetables. If you do use them, rinse them well to decrease the sodium.  When eating at a restaurant, ask that your food be prepared with less salt, or no salt if possible. WHAT FOODS CAN I  EAT? Seek help from a dietitian for individual calorie needs. Grains Whole grain or whole wheat bread. Brown rice. Whole grain or whole wheat pasta. Quinoa, bulgur, and whole grain cereals. Low-sodium cereals. Corn or whole wheat flour tortillas. Whole grain cornbread. Whole grain crackers. Low-sodium crackers. Vegetables Fresh or frozen vegetables (raw, steamed, roasted, or grilled). Low-sodium or reduced-sodium tomato and vegetable juices. Low-sodium or reduced-sodium tomato sauce and paste. Low-sodium or reduced-sodium canned vegetables.  Fruits All fresh, canned (in natural juice), or frozen fruits. Meat and Other Protein Products Ground beef (85% or leaner), grass-fed beef, or beef trimmed of fat. Skinless chicken or [place]key. Ground chicken or [place]key. Pork trimmed of fat. All fish and seafood. Eggs. Dried beans, peas, or lentils. Unsalted nuts and seeds. Unsalted canned beans. Dairy Low-fat dairy products, such as skim or 1% milk, 2% or reduced-fat cheeses, low-fat ricotta or cottage cheese, or plain low-fat yogurt. Low-sodium or reduced-sodium cheeses. Fats and Oils Tub margarines without trans fats. Light or reduced-fat mayonnaise and salad dressings (reduced sodium). Avocado. Safflower, olive, or canola oils. Natural peanut or almond butter. Other Unsalted popcorn and pretzels. The items listed above may not be a complete list of recommended foods or beverages. Contact your dietitian for more options. WHAT FOODS ARE NOT RECOMMENDED? Grains White bread. White pasta. White rice. Refined cornbread. Bagels and croissants. Crackers that contain trans fat. Vegetables Creamed or fried vegetables. Vegetables in a cheese sauce. Regular canned vegetables. Regular  canned tomato sauce and paste. Regular tomato and vegetable juices. Fruits Dried fruits. Canned fruit in light or heavy syrup. Fruit juice. Meat and Other Protein Products Fatty cuts of meat. Ribs, chicken wings, bacon, sausage,  bologna, salami, chitterlings, fatback, hot dogs, bratwurst, and packaged luncheon meats. Salted nuts and seeds. Canned beans with salt. Dairy Whole or 2% milk, cream, half-and-half, and cream cheese. Whole-fat or sweetened yogurt. Full-fat cheeses or blue cheese. Nondairy creamers and whipped toppings. Processed cheese, cheese spreads, or cheese curds. Condiments Onion and garlic salt, seasoned salt, table salt, and sea salt. Canned and packaged gravies. Worcestershire sauce. Tartar sauce. Barbecue sauce. Teriyaki sauce. Soy sauce, including reduced sodium. Steak sauce. Fish sauce. Oyster sauce. Cocktail sauce. Horseradish. Ketchup and mustard. Meat flavorings and tenderizers. Bouillon cubes. Hot sauce. Tabasco sauce. Marinades. Taco seasonings. Relishes. Fats and Oils Butter, stick margarine, lard, shortening, ghee, and bacon fat. Coconut, palm kernel, or palm oils. Regular salad dressings. Other Pickles and olives. Salted popcorn and pretzels. The items listed above may not be a complete list of foods and beverages to avoid. Contact your dietitian for more information. WHERE CAN I FIND MORE INFORMATION? National Heart, Lung, and Blood Institute: CableProm[employer].itwww.nhlbi.nih.gov/health/health-topics/topics/dash/ Document Released: 05/28/2011 Document Revised: 10/23/2013 Document Reviewed: 04/12/2013 [facility] Patient Information 2015 [place], [place]. This information is not intended to replace advice given to you by your health care provider. Make sure you discuss any questions you have with your health care provider.

## 2014-04-24 ENCOUNTER — Ambulatory Visit: Payer: PRIVATE HEALTH INSURANCE | Admitting: Licensed Clinical Social Worker

## 2014-04-29 ENCOUNTER — Other Ambulatory Visit: Payer: Self-pay | Admitting: Family Medicine

## 2014-04-30 NOTE — Telephone Encounter (Signed)
Last seen 03/12/14 and filled 01/29/14 #30 with 2 refills.   UDS 09/28/13 Low Risk   Please advise     KP

## 2014-05-02 ENCOUNTER — Ambulatory Visit (INDEPENDENT_AMBULATORY_CARE_PROVIDER_SITE_OTHER): Payer: 59 | Admitting: Physician Assistant

## 2014-05-02 ENCOUNTER — Encounter: Payer: Self-pay | Admitting: Physician Assistant

## 2014-05-02 VITALS — BP 112/82 | HR 64 | Temp 98.6°F | Resp 16 | Ht 64.0 in | Wt 190.0 lb

## 2014-05-02 DIAGNOSIS — E876 Hypokalemia: Secondary | ICD-10-CM

## 2014-05-02 DIAGNOSIS — I10 Essential (primary) hypertension: Secondary | ICD-10-CM

## 2014-05-02 DIAGNOSIS — R053 Chronic cough: Secondary | ICD-10-CM

## 2014-05-02 DIAGNOSIS — R05 Cough: Secondary | ICD-10-CM

## 2014-05-02 LAB — BASIC METABOLIC PANEL
BUN: 13 mg/dL (ref 6–23)
CALCIUM: 9.7 mg/dL (ref 8.4–10.5)
CHLORIDE: 116 meq/L — AB (ref 96–112)
CO2: 21 mEq/L (ref 19–32)
Creatinine, Ser: 1.5 mg/dL — ABNORMAL HIGH (ref 0.4–1.2)
GFR: 40.3 mL/min — ABNORMAL LOW (ref >60.00)
Glucose, Bld: 105 mg/dL — ABNORMAL HIGH (ref 70–99)
Potassium: 4.4 mEq/L (ref 3.5–5.1)
Sodium: 151 mEq/L — ABNORMAL HIGH (ref 135–145)

## 2014-05-02 NOTE — Progress Notes (Signed)
Pre visit review using our clinic review tool, if applicable. No additional management support is needed unless otherwise documented below in the visit note/SLS  

## 2014-05-02 NOTE — Assessment & Plan Note (Signed)
Resolved with discontinuation of ACEI.  No further treatment needed at present.

## 2014-05-02 NOTE — Assessment & Plan Note (Signed)
BP normotensive despite removal of anti-hypertensive agent.  Will attempt 1-2 month trial of TLC and reassess BP at that time.  Continue DASH diet.  Will check BMP today to reassess potassium.  If looking good, will stop supplementation.

## 2014-05-02 NOTE — Addendum Note (Signed)
Addended by: Verdie ShireBAYNES, ANGELA M on: 05/02/2014 09:38 AM   Modules accepted: Orders

## 2014-05-02 NOTE — Addendum Note (Signed)
Addended by: Verdie ShireBAYNES, Amaree Loisel M on: 05/02/2014 09:40 AM   Modules accepted: Orders

## 2014-05-02 NOTE — Progress Notes (Signed)
Patient presents to clinic today for follow-up of chronic cough.  ACEI was stopped at last visit due to chronic cough.  Patient endorses complete resolution of cough.  Denies new symptoms.  Has been checking BP at home daily.  Endorses highest BP around 130/85.  BP in clinic today is normotensive.   Past Medical History  Diagnosis Date  . Depression   . Hypertension   . Frequent headaches     Current Outpatient Prescriptions on File Prior to Visit  Medication Sig Dispense Refill  . chlorpheniramine-HYDROcodone (TUSSIONEX PENNKINETIC ER) 10-8 MG/5ML LQCR Take 5 mLs by mouth every 12 (twelve) hours as needed. 115 mL 0  . clonazePAM (KLONOPIN) 1 MG tablet TAKE 1 TABLET BY MOUTH EVERY DAY 30 tablet 2  . escitalopram (LEXAPRO) 20 MG tablet TAKE 1 TABLET (20 MG TOTAL) BY MOUTH DAILY. 90 tablet 1  . fluticasone (FLONASE) 50 MCG/ACT nasal spray Place 2 sprays into both nostrils daily. 16 g 6  . potassium chloride SA (K-DUR,KLOR-CON) 20 MEQ tablet Take 1 tablet (20 mEq total) by mouth once. 30 tablet 0   No current facility-administered medications on file prior to visit.    No Known Allergies  Family History  Problem Relation Age of Onset  . COPD Mother   . COPD Father   . Emphysema Mother     Smoker  . Emphysema Father     Smoker    History   Social History  . Marital Status: Divorced    Spouse Name: N/A    Number of Children: N/A  . Years of Education: N/A   Social History Main Topics  . Smoking status: Never Smoker   . Smokeless tobacco: Never Used  . Alcohol Use: No  . Drug Use: No  . Sexual Activity: None   Other Topics Concern  . None   Social History Narrative   Review of Systems - See HPI.  All other ROS are negative.  BP 112/82 mmHg  Pulse 64  Temp(Src) 98.6 F (37 C) (Oral)  Resp 16  Ht 5' 4"  (1.626 m)  Wt 190 lb (86.183 kg)  BMI 32.60 kg/m2  SpO2 98%  Physical Exam  Constitutional: She is oriented to person, place, and time and well-developed,  well-nourished, and in no distress.  HENT:  Head: Normocephalic and atraumatic.  Eyes: Conjunctivae are normal. Pupils are equal, round, and reactive to light.  Neck: Neck supple.  Cardiovascular: Normal rate, regular rhythm, normal heart sounds and intact distal pulses.   Pulmonary/Chest: Effort normal and breath sounds normal. No respiratory distress. She has no wheezes. She has no rales. She exhibits no tenderness.  Neurological: She is alert and oriented to person, place, and time.  Skin: Skin is warm and dry. No rash noted.  Psychiatric: Affect normal.  Vitals reviewed.  Recent Results (from the past 2160 hour(s))  Throat culture Randell Loop)     Status: None   Collection Time: 03/12/14  5:23 PM  Result Value Ref Range   Organism ID, Bacteria Normal Upper Respiratory Flora    Organism ID, Bacteria No Beta Hemolytic Streptococci Isolated   Urine cytology ancillary only     Status: None   Collection Time: 03/21/14 12:00 AM  Result Value Ref Range   Chlamydia CT: Negative     Comment: Normal Reference Range - Negative   Neisseria gonorrhea NG: Negative     Comment: Normal Reference Range - Negative   Trich Trichomonas: Negative     Comment: Normal  Reference Range - Negative  Urine cytology ancillary only     Status: None   Collection Time: 03/21/14 12:00 AM  Result Value Ref Range   Bacterial vaginitis Negative for Bacterial Vaginitis Microorganisms     Comment: Normal Reference Range - Negative   Candida vaginitis Negative for Candida Vaginitis Microorganisms     Comment: Normal Reference Range - Negative  CBC w/Diff     Status: None   Collection Time: 03/21/14  2:48 PM  Result Value Ref Range   WBC 9.8 4.0 - 10.5 K/uL   RBC 4.66 3.87 - 5.11 Mil/uL   Hemoglobin 13.0 12.0 - 15.0 g/dL   HCT 38.9 36.0 - 46.0 %   MCV 83.3 78.0 - 100.0 fl   MCHC 33.5 30.0 - 36.0 g/dL   RDW 14.9 11.5 - 15.5 %   Platelets 309.0 150.0 - 400.0 K/uL   Neutrophils Relative % 69.7 43.0 - 77.0 %    Lymphocytes Relative 20.6 12.0 - 46.0 %   Monocytes Relative 6.4 3.0 - 12.0 %   Eosinophils Relative 2.8 0.0 - 5.0 %   Basophils Relative 0.5 0.0 - 3.0 %   Neutro Abs 6.8 1.4 - 7.7 K/uL   Lymphs Abs 2.0 0.7 - 4.0 K/uL   Monocytes Absolute 0.6 0.1 - 1.0 K/uL   Eosinophils Absolute 0.3 0.0 - 0.7 K/uL   Basophils Absolute 0.0 0.0 - 0.1 K/uL  Basic Metabolic Panel (BMET)     Status: Abnormal   Collection Time: 03/21/14  2:48 PM  Result Value Ref Range   Sodium 139 135 - 145 mEq/L   Potassium 2.8 (LL) 3.5 - 5.1 mEq/L   Chloride 104 96 - 112 mEq/L   CO2 29 19 - 32 mEq/L   Glucose, Bld 94 70 - 99 mg/dL   BUN 16 6 - 23 mg/dL   Creatinine, Ser 1.2 0.4 - 1.2 mg/dL   Calcium 9.1 8.4 - 10.5 mg/dL   GFR 52.47 (L) >60.00 mL/min  CULTURE, URINE COMPREHENSIVE     Status: None   Collection Time: 03/21/14  2:49 PM  Result Value Ref Range   Colony Count NO GROWTH    Organism ID, Bacteria NO GROWTH   POCT urinalysis dipstick     Status: None   Collection Time: 03/21/14  2:49 PM  Result Value Ref Range   Color, UA Gold    Clarity, UA clear     Comment: Odorous   Glucose, UA neg    Bilirubin, UA neg    Ketones, UA neg    Spec Grav, UA 1.025    Blood, UA neg    pH, UA 5.0    Protein, UA neg    Urobilinogen, UA 0.2    Nitrite, UA neg    Leukocytes, UA small (1+)   Comp Met (CMET)     Status: Abnormal   Collection Time: 03/26/14  3:12 PM  Result Value Ref Range   Sodium 143 135 - 145 mEq/L   Potassium 3.8 3.5 - 5.1 mEq/L   Chloride 107 96 - 112 mEq/L   CO2 29 19 - 32 mEq/L   Glucose, Bld 81 70 - 99 mg/dL   BUN 14 6 - 23 mg/dL   Creatinine, Ser 1.3 (H) 0.4 - 1.2 mg/dL   Total Bilirubin 0.3 0.2 - 1.2 mg/dL   Alkaline Phosphatase 63 39 - 117 U/L   AST 19 0 - 37 U/L   ALT 21 0 - 35 U/L   Total  Protein 7.3 6.0 - 8.3 g/dL   Albumin 3.6 3.5 - 5.2 g/dL   Calcium 9.0 8.4 - 10.5 mg/dL   GFR 46.46 (L) >60.00 mL/min  CBC w/Diff     Status: None   Collection Time: 03/26/14  3:12 PM  Result  Value Ref Range   WBC 7.1 4.0 - 10.5 K/uL   RBC 4.33 3.87 - 5.11 Mil/uL   Hemoglobin 12.2 12.0 - 15.0 g/dL   HCT 37.0 36.0 - 46.0 %   MCV 85.5 78.0 - 100.0 fl   MCHC 33.0 30.0 - 36.0 g/dL   RDW 15.4 11.5 - 15.5 %   Platelets 318.0 150.0 - 400.0 K/uL   Neutrophils Relative % 58.0 43.0 - 77.0 %   Lymphocytes Relative 30.2 12.0 - 46.0 %   Monocytes Relative 7.2 3.0 - 12.0 %   Eosinophils Relative 4.1 0.0 - 5.0 %   Basophils Relative 0.5 0.0 - 3.0 %   Neutro Abs 4.1 1.4 - 7.7 K/uL   Lymphs Abs 2.1 0.7 - 4.0 K/uL   Monocytes Absolute 0.5 0.1 - 1.0 K/uL   Eosinophils Absolute 0.3 0.0 - 0.7 K/uL   Basophils Absolute 0.0 0.0 - 0.1 K/uL    Assessment/Plan: Chronic cough Resolved with discontinuation of ACEI.  No further treatment needed at present.  HTN (hypertension) BP normotensive despite removal of anti-hypertensive agent.  Will attempt 1-2 month trial of TLC and reassess BP at that time.  Continue DASH diet.  Will check BMP today to reassess potassium.  If looking good, will stop supplementation.

## 2014-05-02 NOTE — Patient Instructions (Signed)
Please continue following the DASH diet for BP control.  Increase cardiovascular exercise.  Stop by the lab for a repeat potassium check.  We will likely be able to discontinue this supplement, but will know for sure once labs are in.  Follow-up in 1 month with Dr. Laury AxonLowne  DASH Eating Plan DASH stands for "Dietary Approaches to Stop Hypertension." The DASH eating plan is a healthy eating plan that has been shown to reduce high blood pressure (hypertension). Additional health benefits may include reducing the risk of type 2 diabetes mellitus, heart disease, and stroke. The DASH eating plan may also help with weight loss. WHAT DO I NEED TO KNOW ABOUT THE DASH EATING PLAN? For the DASH eating plan, you will follow these general guidelines:  Choose foods with a percent daily value for sodium of less than 5% (as listed on the food label).  Use salt-free seasonings or herbs instead of table salt or sea salt.  Check with your health care provider or pharmacist before using salt substitutes.  Eat lower-sodium products, often labeled as "lower sodium" or "no salt added."  Eat fresh foods.  Eat more vegetables, fruits, and low-fat dairy products.  Choose whole grains. Look for the word "whole" as the first word in the ingredient list.  Choose fish and skinless chicken or [place] more often than red meat. Limit fish, poultry, and meat to 6 oz (170 g) each day.  Limit sweets, desserts, sugars, and sugary drinks.  Choose heart-healthy fats.  Limit cheese to 1 oz (28 g) per day.  Eat more home-cooked food and less restaurant, buffet, and fast food.  Limit fried foods.  Cook foods using methods other than frying.  Limit canned vegetables. If you do use them, rinse them well to decrease the sodium.  When eating at a restaurant, ask that your food be prepared with less salt, or no salt if possible. WHAT FOODS CAN I EAT? Seek help from a dietitian for individual calorie needs. Grains Whole grain  or whole wheat bread. Brown rice. Whole grain or whole wheat pasta. Quinoa, bulgur, and whole grain cereals. Low-sodium cereals. Corn or whole wheat flour tortillas. Whole grain cornbread. Whole grain crackers. Low-sodium crackers. Vegetables Fresh or frozen vegetables (raw, steamed, roasted, or grilled). Low-sodium or reduced-sodium tomato and vegetable juices. Low-sodium or reduced-sodium tomato sauce and paste. Low-sodium or reduced-sodium canned vegetables.  Fruits All fresh, canned (in natural juice), or frozen fruits. Meat and Other Protein Products Ground beef (85% or leaner), grass-fed beef, or beef trimmed of fat. Skinless chicken or [place]key. Ground chicken or [place]. Pork trimmed of fat. All fish and seafood. Eggs. Dried beans, peas, or lentils. Unsalted nuts and seeds. Unsalted canned beans. Dairy Low-fat dairy products, such as skim or 1% milk, 2% or reduced-fat cheeses, low-fat ricotta or cottage cheese, or plain low-fat yogurt. Low-sodium or reduced-sodium cheeses. Fats and Oils Tub margarines without trans fats. Light or reduced-fat mayonnaise and salad dressings (reduced sodium). Avocado. Safflower, olive, or canola oils. Natural peanut or almond butter. Other Unsalted popcorn and pretzels. The items listed above may not be a complete list of recommended foods or beverages. Contact your dietitian for more options. WHAT FOODS ARE NOT RECOMMENDED? Grains White bread. White pasta. White rice. Refined cornbread. Bagels and croissants. Crackers that contain trans fat. Vegetables Creamed or fried vegetables. Vegetables in a cheese sauce. Regular canned vegetables. Regular canned tomato sauce and paste. Regular tomato and vegetable juices. Fruits Dried fruits. Canned fruit in light or heavy  syrup. Fruit juice. Meat and Other Protein Products Fatty cuts of meat. Ribs, chicken wings, bacon, sausage, bologna, salami, chitterlings, fatback, hot dogs, bratwurst, and packaged luncheon meats.  Salted nuts and seeds. Canned beans with salt. Dairy Whole or 2% milk, cream, half-and-half, and cream cheese. Whole-fat or sweetened yogurt. Full-fat cheeses or blue cheese. Nondairy creamers and whipped toppings. Processed cheese, cheese spreads, or cheese curds. Condiments Onion and garlic salt, seasoned salt, table salt, and sea salt. Canned and packaged gravies. Worcestershire sauce. Tartar sauce. Barbecue sauce. Teriyaki sauce. Soy sauce, including reduced sodium. Steak sauce. Fish sauce. Oyster sauce. Cocktail sauce. Horseradish. Ketchup and mustard. Meat flavorings and tenderizers. Bouillon cubes. Hot sauce. Tabasco sauce. Marinades. Taco seasonings. Relishes. Fats and Oils Butter, stick margarine, lard, shortening, ghee, and bacon fat. Coconut, palm kernel, or palm oils. Regular salad dressings. Other Pickles and olives. Salted popcorn and pretzels. The items listed above may not be a complete list of foods and beverages to avoid. Contact your dietitian for more information. WHERE CAN I FIND MORE INFORMATION? National Heart, Lung, and Blood Institute: CableProm[employer].itwww.nhlbi.nih.gov/health/health-topics/topics/dash/ Document Released: 05/28/2011 Document Revised: 10/23/2013 Document Reviewed: 04/12/2013 [employer] Patient Information 2015 [employer] [place]. This information is not intended to replace advice given to you by your health care provider. Make sure you discuss any questions you have with your health care provider.

## 2014-05-08 ENCOUNTER — Telehealth: Payer: Self-pay | Admitting: Family Medicine

## 2014-05-08 DIAGNOSIS — E876 Hypokalemia: Secondary | ICD-10-CM

## 2014-05-08 NOTE — Telephone Encounter (Signed)
Patient informed, understood & agreed; future BMET order placed/SLS

## 2014-05-08 NOTE — Telephone Encounter (Signed)
Caller name: Marisha Relation to pt: self Call back number: [phone] Pharmacy:  Reason for call:   Patient requesting last lab results. Patient saw Selena BattenCody for this.

## 2014-05-08 NOTE — Telephone Encounter (Signed)
-----   Message from Waldon MerlWilliam C Martin, PA-C sent at 05/02/2014  3:21 PM EST ----- Potassium looks good.  Stop supplement.  Increase fluid intake and limit salt intake as sodium is high.  I think now that we have stopped BP meds and are stopping potassium supplement, things will improve.  Any history of renal problems in patient?  Recheck BMP 2 weeks.

## 2014-05-20 ENCOUNTER — Other Ambulatory Visit: Payer: Self-pay | Admitting: Physician Assistant

## 2014-05-22 ENCOUNTER — Ambulatory Visit (INDEPENDENT_AMBULATORY_CARE_PROVIDER_SITE_OTHER): Payer: 59 | Admitting: Medical

## 2014-05-22 ENCOUNTER — Encounter: Payer: Self-pay | Admitting: Medical

## 2014-05-22 VITALS — BP 123/83 | HR 73 | Temp 99.0°F | Ht 63.2 in | Wt 194.2 lb

## 2014-05-22 DIAGNOSIS — L0390 Cellulitis, unspecified: Secondary | ICD-10-CM | POA: Insufficient documentation

## 2014-05-22 DIAGNOSIS — L03818 Cellulitis of other sites: Secondary | ICD-10-CM

## 2014-05-22 DIAGNOSIS — L03221 Cellulitis of neck: Secondary | ICD-10-CM

## 2014-05-22 NOTE — Progress Notes (Signed)
Pre visit review using our clinic review tool, if applicable. No additional management support is needed unless otherwise documented below in the visit note. 

## 2014-05-22 NOTE — Progress Notes (Signed)
   Subjective:    Patient ID: Kaylee Gray, female    DOB: January 21, 1966, 48 y.o.   MRN: [id number]  HPI   Pt in for follow up on skin infection. One area is on left side of her neck and left lower quadrant region. Pt in [facility] on Sunday. Pt started bactrim that day. She was also given a IM injection antibiotic.   Pt told to return to ED tomorrow for recheck and possible I and D. Pt states the area is much improved.  Pt needs paperwork filled out for fmla form filled out. She missed yesterday and today. Work sent her home yesterday and they don't want her back until she gets over this infection.  Past Medical History  Diagnosis Date  . Depression   . Hypertension   . Frequent headaches     History   Social History  . Marital Status: Divorced    Spouse Name: N/A    Number of Children: N/A  . Years of Education: N/A   Occupational History  . Not on file.   Social History Main Topics  . Smoking status: Never Smoker   . Smokeless tobacco: Never Used  . Alcohol Use: No  . Drug Use: No  . Sexual Activity: Not on file   Other Topics Concern  . Not on file   Social History Narrative    Past Surgical History  Procedure Laterality Date  . Tubal ligation    . Cesarean section    . Abdominal hysterectomy  2007    endometriosis,  TAH BSO    Family History  Problem Relation Age of Onset  . COPD Mother   . COPD Father   . Emphysema Mother     Smoker  . Emphysema Father     Smoker    No Known Allergies  Current Outpatient Prescriptions on File Prior to Visit  Medication Sig Dispense Refill  . chlorpheniramine-HYDROcodone (TUSSIONEX PENNKINETIC ER) 10-8 MG/5ML LQCR Take 5 mLs by mouth every 12 (twelve) hours as needed. 115 mL 0  . clonazePAM (KLONOPIN) 1 MG tablet TAKE 1 TABLET BY MOUTH EVERY DAY 30 tablet 2  . escitalopram (LEXAPRO) 20 MG tablet TAKE 1 TABLET (20 MG TOTAL) BY MOUTH DAILY. 90 tablet 1  . fluticasone (FLONASE) 50 MCG/ACT nasal spray Place  2 sprays into both nostrils daily. 16 g 6   No current facility-administered medications on file prior to visit.    BP 123/83 mmHg  Pulse 73  Temp(Src) 99 F (37.2 C) (Oral)  Ht 5' 3.2" (1.605 m)  Wt 194 lb 3.2 oz (88.089 kg)  BMI 34.20 kg/m2  SpO2 95%       Review of Systems  Constitutional: Negative for fever, chills and fatigue.  Cardiovascular: Negative for chest pain and palpitations.  Skin:       Skin infection/cellulitis left side of neck and lower abdomen left side.        Objective:   Physical Exam General- No acute distress Lungs- CTA Heart- RRR  Lt side of neck red tender warm. 6 cm by 2.5 cm area.Over area of sternocleidomastoid  Lt abdomen- above waist line 3 cm x 1 cm. Red, warm and tender area. Lt abdomen- below waist 1 cmx 1 cm. Red, warm and tender area.     Assessment & Plan:

## 2014-05-22 NOTE — Patient Instructions (Signed)
Your have celluiltis of the lt side neck and left lower abdomen. Continue bactrim and we gave rocephin IM today.  You were told to see emergency dept [facility]  tommorow  for follow up. Since they have seen you they may determine you need admission and iv antbiotics or may get surgeon consult to perform I and D. Note  I and D of the neck would liklely need to be done by specialist or experienced ED physician.   I and D does not appear to be indicated today.  Wound culture of broken down area of skin left abdomen.  Please let me know what they decide tomorrow. If they don't have definitive plan then may refer you to specialist through [facility].  Follow up in 7 days or as needed.  I need some time to fill out your flma form. Will try to fill that out by upcoming Monday.

## 2014-05-22 NOTE — Assessment & Plan Note (Signed)
Your have celluiltis of the lt side neck and left lower abdomen. Continue bactrim and we gave rocephin IM today.  You were told to see emergency dept [facility]  tommorow  for follow up. Since they have seen you they may determine you need admission and iv antbiotics or may get surgeon consult to perform I and D. Note  I and D of the neck would liklely need to be done by specialist or experienced ED physician.   I and D does not appear to be indicated today.  Wound culture of broken down area of skin left abdomen.  Please let me know what they decide tomorrow. If they don't have definitive plan then may refer you to specialist through [facility].

## 2014-05-23 ENCOUNTER — Telehealth: Payer: Self-pay | Admitting: Family Medicine

## 2014-05-23 NOTE — Telephone Encounter (Signed)
Caller name: Lonia ChimeraFrazier, Kateryna Relation to ZO:XWRUpt:self Call back number:[phone] Pharmacy:  Reason for call: pt states Ramon Dredgedward wanted her to call back and provide him an update on her follow up appt at the hospital. Pt states she had an ultra sound and they did an iv antibiotic and then there was an incision made and it was not infected and she was referred to an ear, nose, and throat doctor.

## 2014-05-25 ENCOUNTER — Telehealth: Payer: Self-pay | Admitting: Family Medicine

## 2014-05-25 LAB — WOUND CULTURE
Gram Stain: NONE SEEN
Gram Stain: NONE SEEN
Gram Stain: NONE SEEN

## 2014-05-25 NOTE — Telephone Encounter (Signed)
Caller name: Braiden Relation to pt: Call back number: [phone] Pharmacy:  Reason for call:   [name] calling on FMLA and needs to reflect dates for 11/30 through next Tuesday 05/29/14

## 2014-05-25 NOTE — Telephone Encounter (Signed)
Spoke with patient. FMLA [facility]. JG//CMA

## 2014-05-25 NOTE — Telephone Encounter (Signed)
Yes I have the paper work.

## 2014-05-25 NOTE — Telephone Encounter (Signed)
FMLA paperwork has not been received by me? Was it faxed, did patient drop off, or did she bring it to an appointment?

## 2014-05-29 ENCOUNTER — Telehealth: Payer: Self-pay | Admitting: Medical

## 2014-05-29 NOTE — Telephone Encounter (Signed)
FMLA paperwork faxed to [employer] at [phone]. Fax confirmation received. Copy sent for scanning. JG//CMA

## 2014-05-29 NOTE — Telephone Encounter (Signed)
I sent out FMLA. For pt today. Gave to staff.

## 2014-07-11 ENCOUNTER — Ambulatory Visit (INDEPENDENT_AMBULATORY_CARE_PROVIDER_SITE_OTHER): Payer: 59 | Admitting: Medical

## 2014-07-11 ENCOUNTER — Encounter: Payer: Self-pay | Admitting: Medical

## 2014-07-11 VITALS — BP 133/86 | HR 60 | Temp 98.9°F

## 2014-07-11 DIAGNOSIS — L03221 Cellulitis of neck: Secondary | ICD-10-CM

## 2014-07-11 NOTE — Progress Notes (Signed)
Pre visit review using our clinic review tool, if applicable. No additional management support is needed unless otherwise documented below in the visit note. 

## 2014-07-11 NOTE — Patient Instructions (Signed)
I will fill out the forms for you when I get those and call you when those forms are filled out.No current signs or symptoms of infection.

## 2014-07-11 NOTE — Progress Notes (Signed)
Subjective:    Patient ID: Kaylee Gray, female    DOB: 12-06-1965, 49 y.o.   MRN: [id number]  HPI   Pt in for evaluation. Pt states she needs note stating that she was out until  December 9th. Her ENT actually wrote her out with excuse until the 9th which is the day she returned. So when paperwork comes in should explain ok to return on the 9th. Job accommadation only apply up thru  8th. Also they need to know no restrictions to job duty now.  Pt has note from the ENT stating she will be out until the 9th but she states her work won't accept that.  Pt has been feeling well with no recurrent infection on her neck.  Past Medical History  Diagnosis Date  . Depression   . Hypertension   . Frequent headaches     History   Social History  . Marital Status: Divorced    Spouse Name: N/A    Number of Children: N/A  . Years of Education: N/A   Occupational History  . Not on file.   Social History Main Topics  . Smoking status: Never Smoker   . Smokeless tobacco: Never Used  . Alcohol Use: No  . Drug Use: No  . Sexual Activity: Not on file   Other Topics Concern  . Not on file   Social History Narrative    Past Surgical History  Procedure Laterality Date  . Tubal ligation    . Cesarean section    . Abdominal hysterectomy  2007    endometriosis,  TAH BSO    Family History  Problem Relation Age of Onset  . COPD Mother   . COPD Father   . Emphysema Mother     Smoker  . Emphysema Father     Smoker    No Known Allergies  Current Outpatient Prescriptions on File Prior to Visit  Medication Sig Dispense Refill  . clonazePAM (KLONOPIN) 1 MG tablet TAKE 1 TABLET BY MOUTH EVERY DAY 30 tablet 2  . escitalopram (LEXAPRO) 20 MG tablet TAKE 1 TABLET (20 MG TOTAL) BY MOUTH DAILY. 90 tablet 1  . chlorpheniramine-HYDROcodone (TUSSIONEX PENNKINETIC ER) 10-8 MG/5ML LQCR Take 5 mLs by mouth every 12 (twelve) hours as needed. (Patient not taking: Reported on 07/11/2014) 115 mL  0  . fluticasone (FLONASE) 50 MCG/ACT nasal spray Place 2 sprays into both nostrils daily. (Patient not taking: Reported on 07/11/2014) 16 g 6  . sulfamethoxazole-trimethoprim (BACTRIM DS,SEPTRA DS) 800-160 MG per tablet Take 1 tablet by mouth 2 (two) times daily.     No current facility-administered medications on file prior to visit.    BP 133/86 mmHg  Pulse 60  Temp(Src) 98.9 F (37.2 C) (Oral)          Review of Systems  Constitutional: Negative for fever, chills and fatigue.  Respiratory: Negative for cough, choking, shortness of breath and wheezing.   Cardiovascular: Negative for chest pain and palpitations.  Skin: Negative for color change and rash.  Neurological: Negative for dizziness, weakness and headaches.  Hematological: Negative for adenopathy.       Objective:   Physical Exam   General  Mental Status - Alert. General Appearance - Well groomed. Not in acute distress.  Skin Rashes- No Rashes. On exam. Her left side of neck normal except from prior I and D has scar. No redness, no wamth or tenderness.    Neck Neck- Supple. No Masses.   Chest and  Lung Exam Auscultation: Breath Sounds:- even and unlabored, but bilateral upper lobe rhonchi.  Cardiovascular Auscultation:Rythm- Regular, rate and rhythm. Murmurs & Other Heart Sounds:Ausculatation of the heart reveal- No Murmurs.  Lymphatic Head & Neck General Head & Neck Lymphatics: Bilateral: Description- No Localized lymphadenopathy.         Assessment & Plan:

## 2014-07-11 NOTE — Assessment & Plan Note (Signed)
This area looks healed except for scar. No current active infection. I will only fill out her forms as stated in AVS. Write until the December 9th as ENT had written. Also she needs no restriction presently.

## 2014-07-12 ENCOUNTER — Telehealth: Payer: Self-pay | Admitting: *Deleted

## 2014-07-12 NOTE — Telephone Encounter (Signed)
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 Forms completed as much as possible and [facility]. JG//CMA

## 2014-07-15 DIAGNOSIS — Z7689 Persons encountering health services in other specified circumstances: Secondary | ICD-10-CM

## 2014-07-16 NOTE — Telephone Encounter (Signed)
[facility] successfully. JG//CMA

## 2014-07-20 NOTE — Telephone Encounter (Addendum)
Forms were incomplete and need to be revised. Please call patient [phone]

## 2014-07-20 NOTE — Telephone Encounter (Signed)
Incomplete forms received via faxed and placed in Edward's red folder for review and completion.

## 2014-07-24 ENCOUNTER — Telehealth: Payer: Self-pay | Admitting: Medical

## 2014-07-24 NOTE — Telephone Encounter (Signed)
Gave form to Shanda BumpsJessica to ask what part of form was incomplete. I only saw that diagnosis date was incomplete and I filled that in. So she will call and get clarification on any other part that needs to be filled. Also if any deadline date.

## 2014-07-25 NOTE — Telephone Encounter (Signed)
Patient would like a callback when this has been completed. Best # [phone]

## 2014-07-25 NOTE — Telephone Encounter (Signed)
Completed forms faxed. Called and informed patient. JG//CMA

## 2014-07-25 NOTE — Telephone Encounter (Signed)
Patient states that these forms need to be completed by 07/30/14

## 2014-07-31 ENCOUNTER — Other Ambulatory Visit: Payer: Self-pay | Admitting: Family Medicine

## 2014-08-01 ENCOUNTER — Other Ambulatory Visit: Payer: Self-pay | Admitting: Family Medicine

## 2014-08-01 NOTE — Telephone Encounter (Signed)
Last seen 03/12/14 and filled 04/30/14 #30 with 2 refills. UDS 09/28/13 low risk  Please advise      KP

## 2014-08-02 MED ORDER — CLONAZEPAM 1 MG PO TABS: 1.0000 mg | ORAL_TABLET | Freq: Every day | ORAL | Status: DC

## 2014-08-02 NOTE — Addendum Note (Signed)
Addended by: Arnette NorrisPAYNE, Evgenia Merriman P on: 08/02/2014 08:05 AM   Modules accepted: Orders

## 2014-10-01 ENCOUNTER — Other Ambulatory Visit: Payer: Self-pay | Admitting: Family Medicine

## 2014-10-02 ENCOUNTER — Telehealth: Payer: Self-pay | Admitting: Family Medicine

## 2014-10-02 NOTE — Telephone Encounter (Signed)
lexapro 10 mg [facility]  She was told to make an appt prior to refill she is scheduled for next Tuesday  Please send in refill

## 2014-10-02 NOTE — Telephone Encounter (Signed)
Rx for 20 mg Lexapro in the system, I called to confirm the dose. Msg left.      KP

## 2014-10-02 NOTE — Telephone Encounter (Signed)
Patient states that she is pretty sure that it is 10mg 

## 2014-10-03 MED ORDER — ESCITALOPRAM OXALATE 10 MG PO TABS: 10.0000 mg | ORAL_TABLET | Freq: Every day | ORAL | Status: DC

## 2014-10-03 NOTE — Telephone Encounter (Signed)
Lexapro 10 mg has been sent.      KP

## 2014-10-09 ENCOUNTER — Encounter: Payer: Self-pay | Admitting: Family Medicine

## 2014-10-09 ENCOUNTER — Ambulatory Visit (INDEPENDENT_AMBULATORY_CARE_PROVIDER_SITE_OTHER): Payer: 59 | Admitting: Family Medicine

## 2014-10-09 VITALS — BP 116/80 | HR 68 | Temp 99.3°F | Wt 203.2 lb

## 2014-10-09 DIAGNOSIS — F418 Other specified anxiety disorders: Secondary | ICD-10-CM | POA: Diagnosis not present

## 2014-10-09 DIAGNOSIS — G4700 Insomnia, unspecified: Secondary | ICD-10-CM

## 2014-10-09 DIAGNOSIS — F411 Generalized anxiety disorder: Secondary | ICD-10-CM | POA: Diagnosis not present

## 2014-10-09 NOTE — Patient Instructions (Signed)
Insomnia Insomnia is frequent trouble falling and/or staying asleep. Insomnia can be a long term problem or a short term problem. Both are common. Insomnia can be a short term problem when the wakefulness is related to a certain stress or worry. Long term insomnia is often related to ongoing stress during waking hours and/or poor sleeping habits. Overtime, sleep deprivation itself can make the problem worse. Every little thing feels more severe because you are overtired and your ability to cope is decreased. CAUSES   Stress, anxiety, and depression.  Poor sleeping habits.  Distractions such as TV in the bedroom.  Naps close to bedtime.  Engaging in emotionally charged conversations before bed.  Technical reading before sleep.  Alcohol and other sedatives. They may make the problem worse. They can hurt normal sleep patterns and normal dream activity.  Stimulants such as caffeine for several hours prior to bedtime.  Pain syndromes and shortness of breath can cause insomnia.  Exercise late at night.  Changing time zones may cause sleeping problems (jet lag). It is sometimes helpful to have someone observe your sleeping patterns. They should look for periods of not breathing during the night (sleep apnea). They should also look to see how long those periods last. If you live alone or observers are uncertain, you can also be observed at a sleep clinic where your sleep patterns will be professionally monitored. Sleep apnea requires a checkup and treatment. Give your caregivers your medical history. Give your caregivers observations your family has made about your sleep.  SYMPTOMS   Not feeling rested in the morning.  Anxiety and restlessness at bedtime.  Difficulty falling and staying asleep. TREATMENT   Your caregiver may prescribe treatment for an underlying medical disorders. Your caregiver can give advice or help if you are using alcohol or other drugs for self-medication. Treatment  of underlying problems will usually eliminate insomnia problems.  Medications can be prescribed for short time use. They are generally not recommended for lengthy use.  Over-the-counter sleep medicines are not recommended for lengthy use. They can be habit forming.  You can promote easier sleeping by making lifestyle changes such as:  Using relaxation techniques that help with breathing and reduce muscle tension.  Exercising earlier in the day.  Changing your diet and the time of your last meal. No night time snacks.  Establish a regular time to go to bed.  Counseling can help with stressful problems and worry.  Soothing music and white noise may be helpful if there are background noises you cannot remove.  Stop tedious detailed work at least one hour before bedtime. HOME CARE INSTRUCTIONS   Keep a diary. Inform your caregiver about your progress. This includes any medication side effects. See your caregiver regularly. Take note of:  Times when you are asleep.  Times when you are awake during the night.  The quality of your sleep.  How you feel the next day. This information will help your caregiver care for you.  Get out of bed if you are still awake after 15 minutes. Read or do some quiet activity. Keep the lights down. Wait until you feel sleepy and go back to bed.  Keep regular sleeping and waking hours. Avoid naps.  Exercise regularly.  Avoid distractions at bedtime. Distractions include watching television or engaging in any intense or detailed activity like attempting to balance the household checkbook.  Develop a bedtime ritual. Keep a familiar routine of bathing, brushing your teeth, climbing into bed at the same   time each night, listening to soothing music. Routines increase the success of falling to sleep faster.  Use relaxation techniques. This can be using breathing and muscle tension release routines. It can also include visualizing peaceful scenes. You can  also help control troubling or intruding thoughts by keeping your mind occupied with boring or repetitive thoughts like the old concept of counting sheep. You can make it more creative like imagining planting one beautiful flower after another in your backyard garden.  During your day, work to eliminate stress. When this is not possible use some of the previous suggestions to help reduce the anxiety that accompanies stressful situations. MAKE SURE YOU:   Understand these instructions.  Will watch your condition.  Will get help right away if you are not doing well or get worse. Document Released: 06/05/2000 Document Revised: 08/31/2011 Document Reviewed: 07/06/2007 [employer] Patient Information 2015 [employer]. This information is not intended to replace advice given to you by your health care provider. Make sure you discuss any questions you have with your health care provider.  

## 2014-10-09 NOTE — Progress Notes (Addendum)
Patient ID: Kaylee Gray, female    DOB: 1966/01/09  Age: 49 y.o. MRN: [id number]    Subjective:  Subjective HPI Kaylee Gray presents for f/u anxiety.  She wants to come off the lexapro and klonopin.   Review of Systems  Constitutional: Negative for activity change, appetite change, fatigue and unexpected weight change.  Respiratory: Negative for cough and shortness of breath.   Cardiovascular: Negative for chest pain and palpitations.  Psychiatric/Behavioral: Negative for behavioral problems and dysphoric mood. The patient is not nervous/anxious.     History Past Medical History  Diagnosis Date  . Depression   . Hypertension   . Frequent headaches     She has past surgical history that includes Tubal ligation; Cesarean section; and Abdominal hysterectomy (2007).   Her family history includes COPD in her father and mother; Emphysema in her father and mother.She reports that she has never smoked. She has never used smokeless tobacco. She reports that she does not drink alcohol or use illicit drugs.  Current Outpatient Prescriptions on File Prior to Visit  Medication Sig Dispense Refill  . clonazePAM (KLONOPIN) 1 MG tablet Take 1 tablet (1 mg total) by mouth daily. 30 tablet 2  . escitalopram (LEXAPRO) 10 MG tablet Take 1 tablet (10 mg total) by mouth daily. 30 tablet 0   No current facility-administered medications on file prior to visit.     Objective:  Objective Physical Exam  Constitutional: She is oriented to person, place, and time. She appears well-developed and well-nourished. No distress.  Eyes: EOM are normal.  Neck: Neck supple.  Pulmonary/Chest: No respiratory distress. She has no wheezes. She has no rales. She exhibits no tenderness.  Neurological: She is alert and oriented to person, place, and time.  Psychiatric: She has a normal mood and affect. Her behavior is normal. Judgment and thought content normal.   BP 116/80 mmHg  Pulse 68  Temp(Src) 99.3 F  (37.4 C) (Oral)  Wt 203 lb 3.2 oz (92.171 kg)  SpO2 95% Wt Readings from Last 3 Encounters:  10/09/14 203 lb 3.2 oz (92.171 kg)  05/22/14 194 lb 3.2 oz (88.089 kg)  05/02/14 190 lb (86.183 kg)     Lab Results  Component Value Date   WBC 7.1 03/26/2014   HGB 12.2 03/26/2014   HCT 37.0 03/26/2014   PLT 318.0 03/26/2014   GLUCOSE 105* 05/02/2014   ALT 21 03/26/2014   AST 19 03/26/2014   NA 151* 05/02/2014   K 4.4 05/02/2014   CL 116* 05/02/2014   CREATININE 1.5* 05/02/2014   BUN 13 05/02/2014   CO2 21 05/02/2014    Dg Chest 2 View  03/26/2014   CLINICAL DATA:  Five days of cough and fever ; history of cough and shortness of breath and hypertension; no history of tobacco use. Subsequent encounter  EXAM: CHEST  2 VIEW  COMPARISON:  PA and lateral chest x-ray of March 11, 2014  FINDINGS: The lungs are adequately inflated. There is no focal infiltrate. The lung markings are coarse in the retrocardiac region on the lateral film but the projection is different than on the earlier study. There is no pleural effusion or pneumothorax. The heart and pulmonary vascularity are within the limits of normal. The bony thorax is unremarkable.  IMPRESSION: There is no evidence of pneumonia. One cannot acute bronchitis in the appropriate clinical setting.   Electronically Signed   By: David  [place]   On: 03/26/2014 15:51     Assessment & Plan:  Plan I have discontinued Kaylee Gray fluticasone, chlorpheniramine-HYDROcodone, and sulfamethoxazole-trimethoprim. I am also having her maintain her clonazePAM and escitalopram.  No orders of the defined types were placed in this encounter.    Problem List Items Addressed This Visit    Depression with anxiety    con't lexapro and xanax rto 1 month       Other Visit Diagnoses    Insomnia    -  Primary    Anxiety state           Follow-up: Return if symptoms worsen or fail to improve.  Kaylee Freud, DO

## 2014-10-09 NOTE — Progress Notes (Signed)
Pre visit review using our clinic review tool, if applicable. No additional management support is needed unless otherwise documented below in the visit note. 

## 2014-10-09 NOTE — Assessment & Plan Note (Signed)
con't lexapro and xanax rto 1 month

## 2014-10-23 ENCOUNTER — Telehealth: Payer: Self-pay | Admitting: Family Medicine

## 2014-10-23 NOTE — Telephone Encounter (Signed)
What medications?

## 2014-10-23 NOTE — Telephone Encounter (Signed)
She has gone back on both of her meds

## 2014-10-24 NOTE — Telephone Encounter (Signed)
Called and left a message for the patient to call back  When she originally called I asked her what meds and she said she and Dr Laury AxonLowne had spoken about them at her last visit and she would know

## 2014-10-25 MED ORDER — ESCITALOPRAM OXALATE 10 MG PO TABS: 10.0000 mg | ORAL_TABLET | Freq: Every day | ORAL | Status: DC

## 2014-10-25 MED ORDER — CLONAZEPAM 1 MG PO TABS: 1.0000 mg | ORAL_TABLET | Freq: Every day | ORAL | Status: DC

## 2014-10-25 NOTE — Telephone Encounter (Signed)
Medications have been faxed.      KP 

## 2014-10-25 NOTE — Telephone Encounter (Signed)
Please advise if it is ok to fill these med's.     KP

## 2014-10-25 NOTE — Telephone Encounter (Signed)
Please advise      KP 

## 2014-10-25 NOTE — Telephone Encounter (Signed)
6 months on lexapro 1 month on klonopin

## 2014-10-25 NOTE — Telephone Encounter (Signed)
I'm guessing she means lexapro and klonopin

## 2014-11-05 ENCOUNTER — Other Ambulatory Visit: Payer: Self-pay | Admitting: Family Medicine

## 2014-12-06 ENCOUNTER — Other Ambulatory Visit: Payer: Self-pay | Admitting: Family Medicine

## 2014-12-06 NOTE — Telephone Encounter (Addendum)
Rx printed and faxed to the pharmacy.  Received confirmation.//AB/CMA 

## 2014-12-06 NOTE — Telephone Encounter (Signed)
Requesting Clonazepam 1mg -Take 1 tablet by mouth every day. Last refill:10-25-14;#30,0 Last OV:10-09-14 UDS:09-28-13-Low risk-Due Please advise.//AB/CMA

## 2015-01-06 ENCOUNTER — Other Ambulatory Visit: Payer: Self-pay | Admitting: Family Medicine

## 2015-01-07 ENCOUNTER — Telehealth: Payer: Self-pay | Admitting: Family Medicine

## 2015-01-07 NOTE — Telephone Encounter (Signed)
Last seen 10/09/14 and filled 12/06/14 #30 UDS 09/28/13 low risk   Please advise     KP

## 2015-01-07 NOTE — Telephone Encounter (Signed)
Patient aware Rx has been approved and I am faxing it today. She verbalized understanding and thanked me for the call.        KP

## 2015-01-07 NOTE — Telephone Encounter (Signed)
Caller name: [name] Relation to pt: self Call back number: [phone] [facility] [address]  Reason for call: Pt called requesting rx for clonazePAM (KLONOPIN) 1 MG tablet, pt also stated wanted to know if there is another medication that can take over for Klonopin that does not have to pick up rx at office and can be sent to pharmacy with no issue. Please advise.

## 2015-02-11 ENCOUNTER — Telehealth: Payer: Self-pay

## 2015-02-11 NOTE — Telephone Encounter (Signed)
Refill x1 

## 2015-02-11 NOTE — Telephone Encounter (Signed)
Last seen 10/09/14 and filled 01/07/15 #30   Please advise     KP

## 2015-02-12 MED ORDER — CLONAZEPAM 1 MG PO TABS: 1.0000 mg | ORAL_TABLET | Freq: Every day | ORAL | Status: DC

## 2015-02-12 NOTE — Telephone Encounter (Signed)
Faxed.   KP 

## 2015-02-27 ENCOUNTER — Other Ambulatory Visit: Payer: Self-pay | Admitting: Family Medicine

## 2015-02-28 ENCOUNTER — Other Ambulatory Visit: Payer: Self-pay

## 2015-02-28 MED ORDER — ESCITALOPRAM OXALATE 10 MG PO TABS: ORAL_TABLET | ORAL | Status: DC

## 2015-03-04 ENCOUNTER — Encounter: Payer: Self-pay | Admitting: Family Medicine

## 2015-03-04 ENCOUNTER — Ambulatory Visit (INDEPENDENT_AMBULATORY_CARE_PROVIDER_SITE_OTHER): Payer: 59 | Admitting: Family Medicine

## 2015-03-04 VITALS — BP 112/80 | HR 70 | Temp 98.8°F | Wt 209.8 lb

## 2015-03-04 DIAGNOSIS — R05 Cough: Secondary | ICD-10-CM | POA: Diagnosis not present

## 2015-03-04 DIAGNOSIS — J069 Acute upper respiratory infection, unspecified: Secondary | ICD-10-CM

## 2015-03-04 DIAGNOSIS — R059 Cough, unspecified: Secondary | ICD-10-CM

## 2015-03-04 MED ORDER — HYDROCODONE-HOMATROPINE 5-1.5 MG/5ML PO SYRP: 5.0000 mL | ORAL_SOLUTION | Freq: Four times a day (QID) | ORAL | Status: AC | PRN

## 2015-03-04 NOTE — Progress Notes (Signed)
  Subjective:     Kaylee Gray is a 49 y.o. female who presents for evaluation of symptoms of a URI. Symptoms include congestion, nasal congestion, no  fever, non productive cough and sore throat. Onset of symptoms was 1 day ago, and has been unchanged since that time. Treatment to date: tylenol.  The following portions of the patient's history were reviewed and updated as appropriate:  She  has a past medical history of Depression; Hypertension; and Frequent headaches. She  does not have any pertinent problems on file. She  has past surgical history that includes Tubal ligation; Cesarean section; and Abdominal hysterectomy (2007). Her family history includes COPD in her father and mother; Emphysema in her father and mother. She  reports that she has never smoked. She has never used smokeless tobacco. She reports that she does not drink alcohol or use illicit drugs. She has a current medication list which includes the following prescription(s): clonazepam and escitalopram. Current Outpatient Prescriptions on File Prior to Visit  Medication Sig Dispense Refill  . clonazePAM (KLONOPIN) 1 MG tablet Take 1 tablet (1 mg total) by mouth daily. 30 tablet 0  . escitalopram (LEXAPRO) 10 MG tablet TAKE 1 TABLET (10 MG TOTAL) BY MOUTH DAILY. OFFICE VISIT DUE IN OCT 90 tablet 0   No current facility-administered medications on file prior to visit.   She has No Known Allergies..  Review of Systems Pertinent items are noted in HPI.   Objective:    BP 112/80 mmHg  Pulse 70  Temp(Src) 98.8 F (37.1 C) (Oral)  Wt 209 lb 12.8 oz (95.165 kg)  SpO2 96% General appearance: alert, cooperative, appears stated age and no distress Head: Normocephalic, without obvious abnormality, atraumatic Ears: normal TM's and external ear canals both ears Nose: clear discharge, moderate congestion, turbinates red, swollen Throat: abnormal findings: mild oropharyngeal erythema Neck: no adenopathy, supple,  symmetrical, trachea midline and thyroid not enlarged, symmetric, no tenderness/mass/nodules Lungs: clear to auscultation bilaterally Heart: S1, S2 normal   Assessment:    viral upper respiratory illness   Plan:    Discussed diagnosis and treatment of URI. Suggested symptomatic OTC remedies. Nasal saline spray for congestion. Nasal steroids per orders. Follow up as needed.   Antihistamine otc

## 2015-03-04 NOTE — Patient Instructions (Addendum)
Rhinocort, nasacort or flonase over the counter Zyrtec is over the counter as well Call if symptoms last more than 10 days or if they worsen      Upper Respiratory Infection, Adult An upper respiratory infection (URI) is also sometimes known as the common cold. The upper respiratory tract includes the nose, sinuses, throat, trachea, and bronchi. Bronchi are the airways leading to the lungs. Most people improve within 1 week, but symptoms can last up to 2 weeks. A residual cough may last even longer.  CAUSES Many different viruses can infect the tissues lining the upper respiratory tract. The tissues become irritated and inflamed and often become very moist. Mucus production is also common. A cold is contagious. You can easily spread the virus to others by oral contact. This includes kissing, sharing a glass, coughing, or sneezing. Touching your mouth or nose and then touching a surface, which is then touched by another person, can also spread the virus. SYMPTOMS  Symptoms typically develop 1 to 3 days after you come in contact with a cold virus. Symptoms vary from person to person. They may include:  Runny nose.  Sneezing.  Nasal congestion.  Sinus irritation.  Sore throat.  Loss of voice (laryngitis).  Cough.  Fatigue.  Muscle aches.  Loss of appetite.  Headache.  Low-grade fever. DIAGNOSIS  You might diagnose your own cold based on familiar symptoms, since most people get a cold 2 to 3 times a year. Your caregiver can confirm this based on your exam. Most importantly, your caregiver can check that your symptoms are not due to another disease such as strep throat, sinusitis, pneumonia, asthma, or epiglottitis. Blood tests, throat tests, and X-rays are not necessary to diagnose a common cold, but they may sometimes be helpful in excluding other more serious diseases. Your caregiver will decide if any further tests are required. RISKS AND COMPLICATIONS  You may be at risk for  a more severe case of the common cold if you smoke cigarettes, have chronic heart disease (such as heart failure) or lung disease (such as asthma), or if you have a weakened immune system. The very young and very old are also at risk for more serious infections. Bacterial sinusitis, middle ear infections, and bacterial pneumonia can complicate the common cold. The common cold can worsen asthma and chronic obstructive pulmonary disease (COPD). Sometimes, these complications can require emergency medical care and may be life-threatening. PREVENTION  The best way to protect against getting a cold is to practice good hygiene. Avoid oral or hand contact with people with cold symptoms. Wash your hands often if contact occurs. There is no clear evidence that vitamin C, vitamin E, echinacea, or exercise reduces the chance of developing a cold. However, it is always recommended to get plenty of rest and practice good nutrition. TREATMENT  Treatment is directed at relieving symptoms. There is no cure. Antibiotics are not effective, because the infection is caused by a virus, not by bacteria. Treatment may include:  Increased fluid intake. Sports drinks offer valuable electrolytes, sugars, and fluids.  Breathing heated mist or steam (vaporizer or shower).  Eating chicken soup or other clear broths, and maintaining good nutrition.  Getting plenty of rest.  Using gargles or lozenges for comfort.  Controlling fevers with ibuprofen or acetaminophen as directed by your caregiver.  Increasing usage of your inhaler if you have asthma. Zinc gel and zinc lozenges, taken in the first 24 hours of the common cold, can shorten the duration and  lessen the severity of symptoms. Pain medicines may help with fever, muscle aches, and throat pain. A variety of non-prescription medicines are available to treat congestion and runny nose. Your caregiver can make recommendations and may suggest nasal or lung inhalers for other  symptoms.  HOME CARE INSTRUCTIONS   Only take over-the-counter or prescription medicines for pain, discomfort, or fever as directed by your caregiver.  Use a warm mist humidifier or inhale steam from a shower to increase air moisture. This may keep secretions moist and make it easier to breathe.  Drink enough water and fluids to keep your urine clear or pale yellow.  Rest as needed.  Return to work when your temperature has returned to normal or as your caregiver advises. You may need to stay home longer to avoid infecting others. You can also use a face mask and careful hand washing to prevent spread of the virus. SEEK MEDICAL CARE IF:   After the first few days, you feel you are getting worse rather than better.  You need your caregiver's advice about medicines to control symptoms.  You develop chills, worsening shortness of breath, or brown or red sputum. These may be signs of pneumonia.  You develop yellow or brown nasal discharge or pain in the face, especially when you bend forward. These may be signs of sinusitis.  You develop a fever, swollen neck glands, pain with swallowing, or white areas in the back of your throat. These may be signs of strep throat. SEEK IMMEDIATE MEDICAL CARE IF:   You have a fever.  You develop severe or persistent headache, ear pain, sinus pain, or chest pain.  You develop wheezing, a prolonged cough, cough up blood, or have a change in your usual mucus (if you have chronic lung disease).  You develop sore muscles or a stiff neck. Document Released: 12/02/2000 Document Revised: 08/31/2011 Document Reviewed: 09/13/2013 [facility] Patient Information 2015 [place], [place]. This information is not intended to replace advice given to you by your health care provider. Make sure you discuss any questions you have with your health care provider.

## 2015-03-04 NOTE — Progress Notes (Signed)
Pre visit review using our clinic review tool, if applicable. No additional management support is needed unless otherwise documented below in the visit note. 

## 2015-03-15 ENCOUNTER — Other Ambulatory Visit: Payer: Self-pay

## 2015-03-15 MED ORDER — CLONAZEPAM 1 MG PO TABS: 1.0000 mg | ORAL_TABLET | Freq: Every day | ORAL | Status: AC

## 2015-03-15 NOTE — Telephone Encounter (Signed)
Last seen 03/04/15 and filled 02/12/15 #30  Please advise     KP

## 2015-03-15 NOTE — Telephone Encounter (Signed)
Refill x1   1 refill 

## 2015-03-15 NOTE — Telephone Encounter (Signed)
Detailed message left on the VM of the pharmacy.Marland Kitchen     KP

## 2015-04-23 ENCOUNTER — Telehealth: Payer: Self-pay | Admitting: *Deleted

## 2015-04-23 NOTE — Telephone Encounter (Signed)
Patient signed ROI [facility]. Forwarded to [place] to scan/email to medical records. JG//CMA

## 2015-06-06 ENCOUNTER — Other Ambulatory Visit: Payer: Self-pay | Admitting: Family Medicine

## 2015-08-22 ENCOUNTER — Telehealth: Payer: Self-pay | Admitting: Family Medicine

## 2015-08-22 NOTE — Telephone Encounter (Signed)
Recorded msg "the wireless customer you are calling is not available"

## 2015-12-06 IMAGING — CR DG ABDOMEN 2V
3 series · 3 of 3 positions shown · non-contrast
Comparison: None.

CLINICAL DATA: Constipation and bloating for 5 days.

EXAM:
ABDOMEN - 2 VIEW

[w abdomen upright]
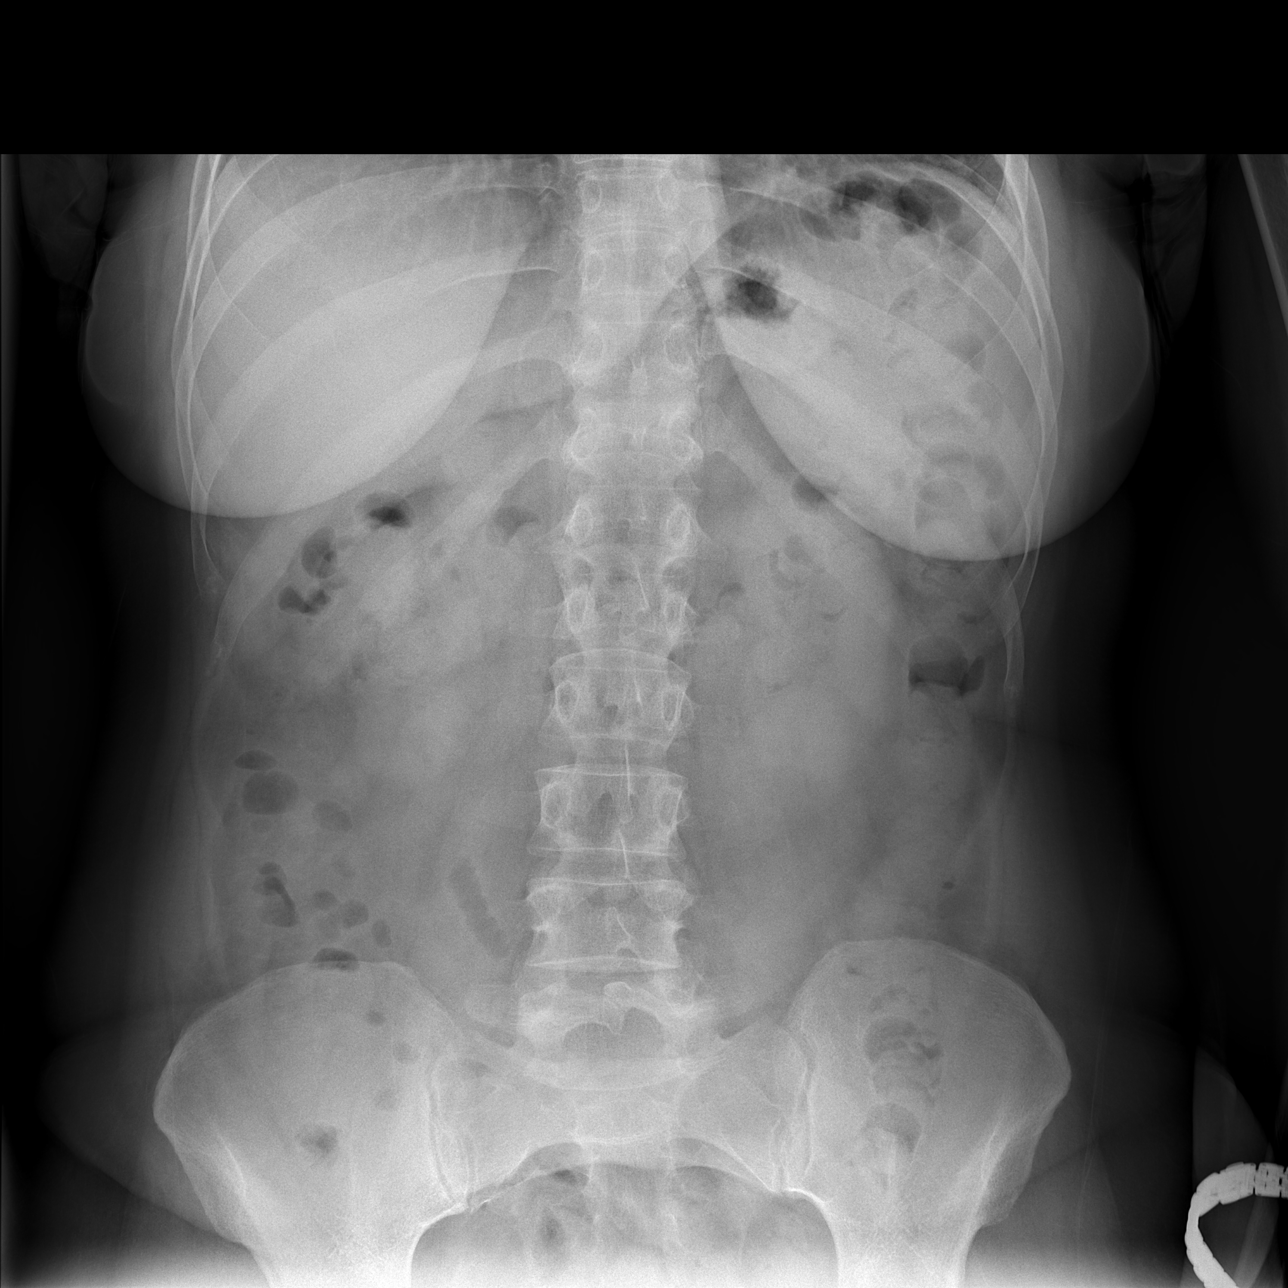

[t abdomen supine (1 of 2)]
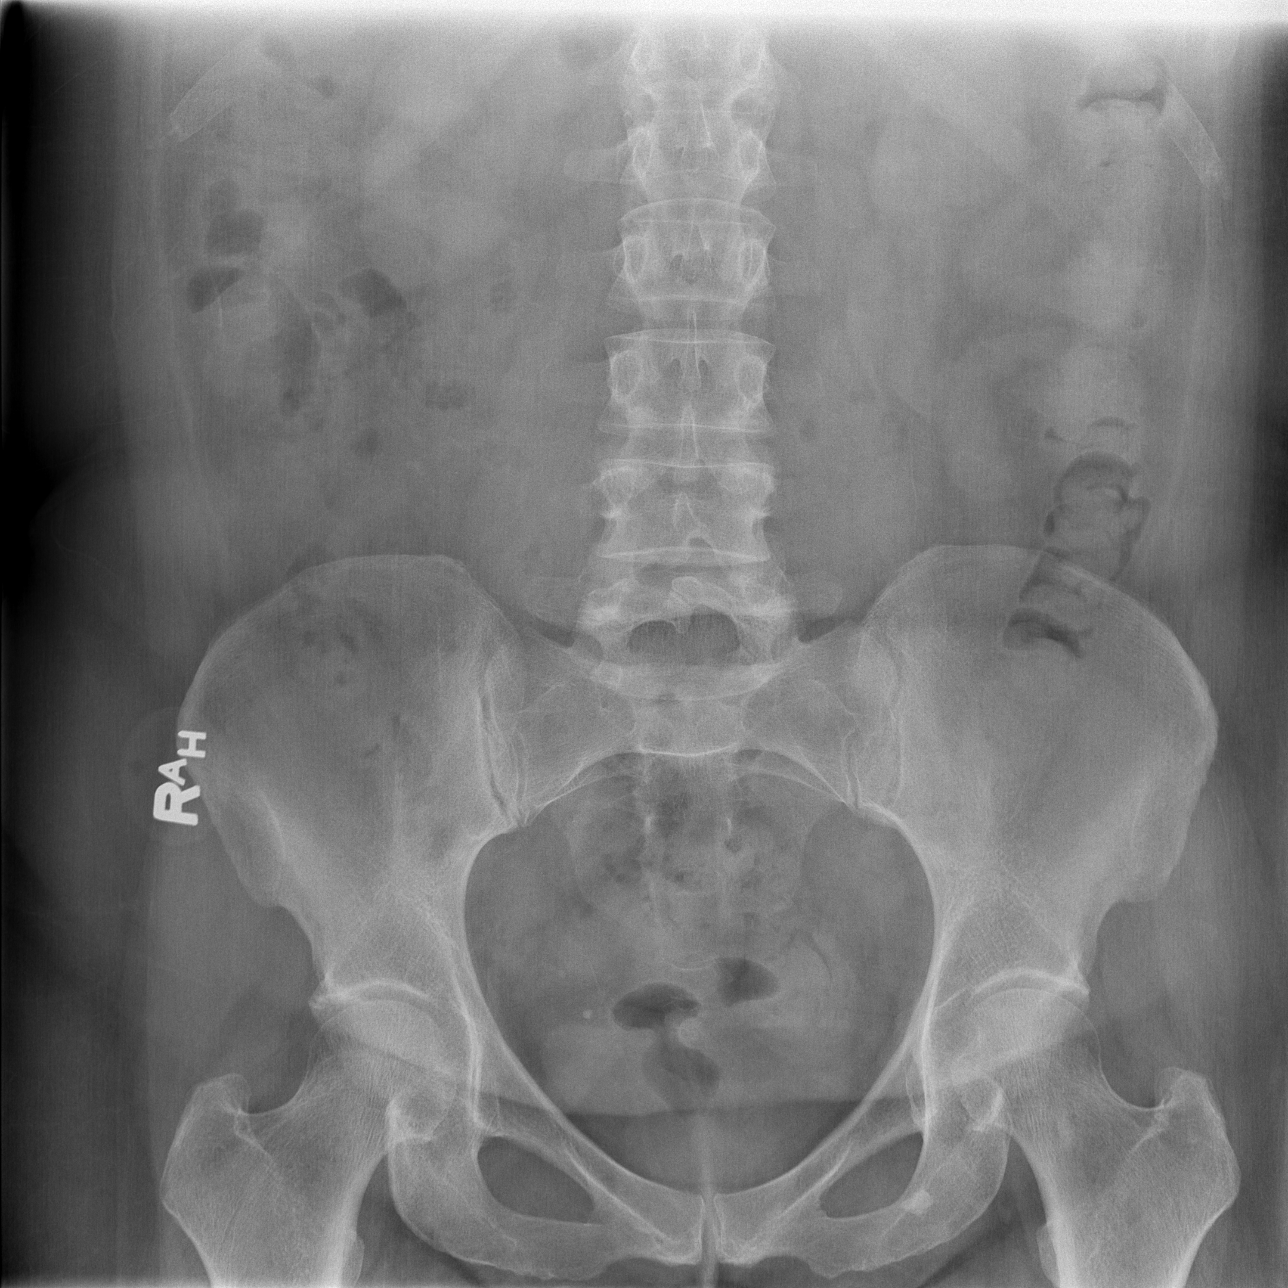

[t abdomen supine (2 of 2)]
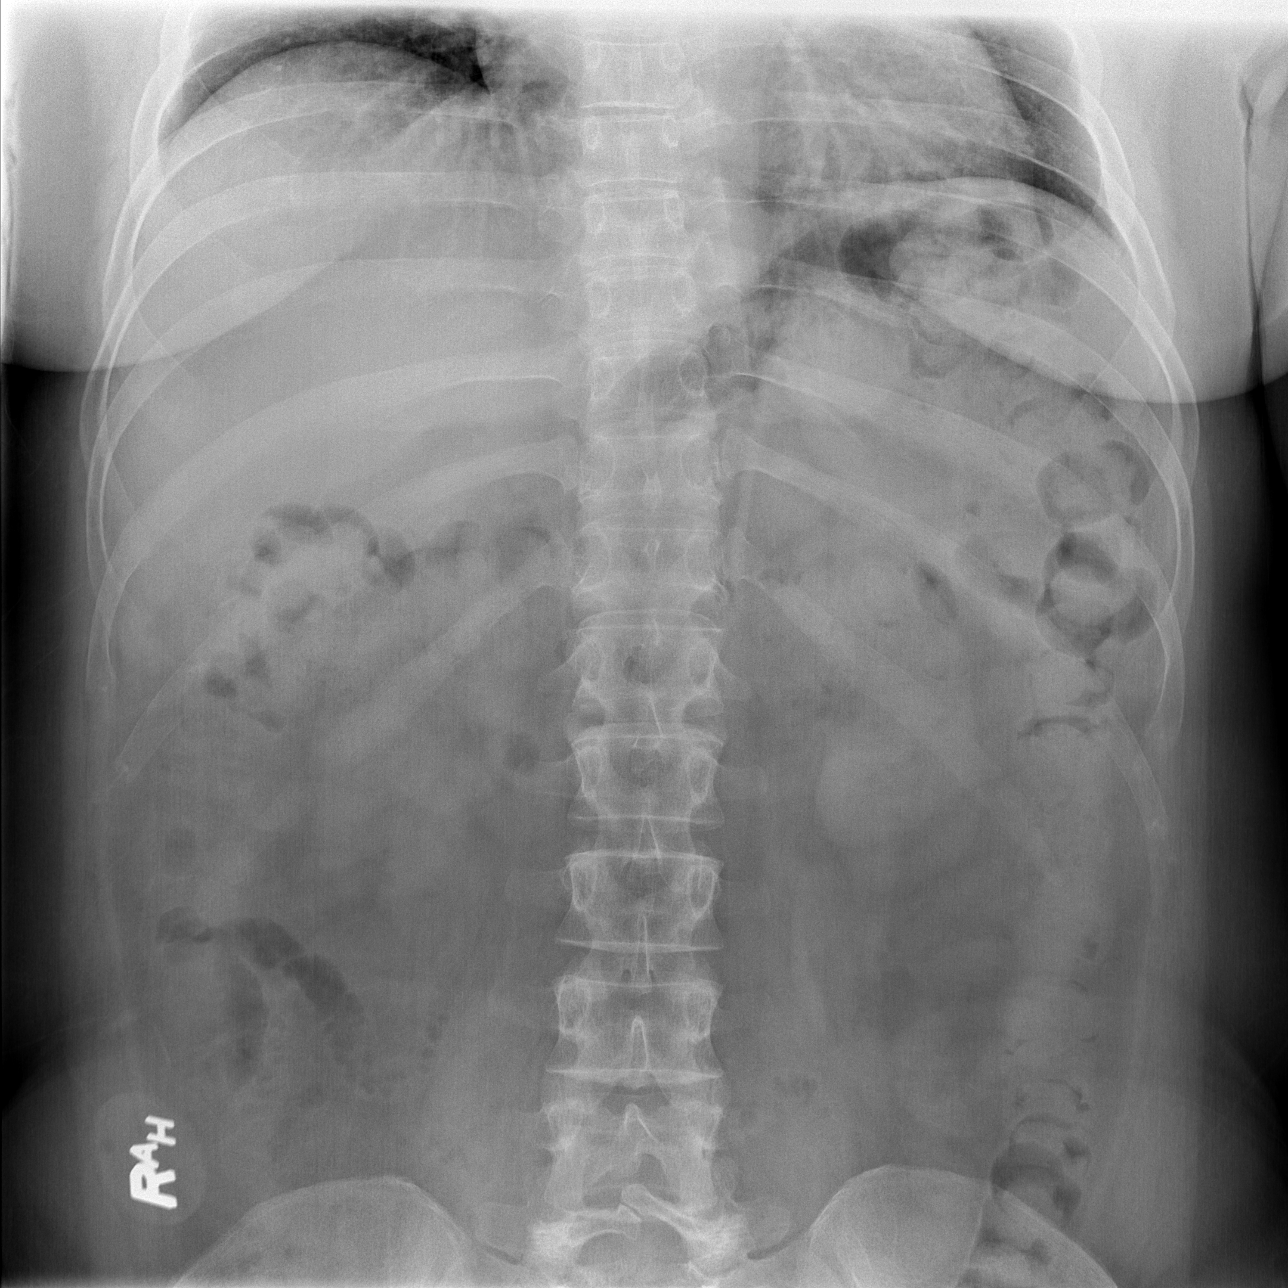

[3 of 3 positions shown; findings below may reference images not displayed]

FINDINGS: The bowel gas pattern is normal. There is no evidence of free air.
No radio-opaque calculi or other significant radiographic
abnormality is seen. Moderate bowel content is noted throughout
colon.
IMPRESSION: No bowel obstruction. Moderate bowel content is noted throughout
colon.
